# Patient Record
Sex: Female | Born: 1945
Health system: Southern US, Community
[De-identification: ages and names within clinical notes are randomized; demographics above are authoritative.]

## PROBLEM LIST (undated history)

## (undated) DIAGNOSIS — M1990 Unspecified osteoarthritis, unspecified site: Secondary | ICD-10-CM

## (undated) DIAGNOSIS — I10 Essential (primary) hypertension: Secondary | ICD-10-CM

## (undated) DIAGNOSIS — R001 Bradycardia, unspecified: Secondary | ICD-10-CM

## (undated) DIAGNOSIS — E119 Type 2 diabetes mellitus without complications: Secondary | ICD-10-CM

## (undated) DIAGNOSIS — G4730 Sleep apnea, unspecified: Secondary | ICD-10-CM

## (undated) HISTORY — PX: TOTAL ABDOMINAL HYSTERECTOMY: SHX209

## (undated) HISTORY — DX: Bradycardia, unspecified: R00.1

## (undated) HISTORY — DX: Sleep apnea, unspecified: G47.30

## (undated) HISTORY — DX: Essential (primary) hypertension: I10

## (undated) HISTORY — PX: REPLACEMENT TOTAL KNEE BILATERAL: SUR1225

## (undated) HISTORY — DX: Type 2 diabetes mellitus without complications: E11.9

## (undated) HISTORY — DX: Unspecified osteoarthritis, unspecified site: M19.90

---

## 1973-06-27 HISTORY — PX: BACK SURGERY: SHX140

## 2015-02-24 DIAGNOSIS — K219 Gastro-esophageal reflux disease without esophagitis: Secondary | ICD-10-CM | POA: Insufficient documentation

## 2015-02-24 DIAGNOSIS — J309 Allergic rhinitis, unspecified: Secondary | ICD-10-CM | POA: Insufficient documentation

## 2015-02-24 DIAGNOSIS — E119 Type 2 diabetes mellitus without complications: Secondary | ICD-10-CM | POA: Insufficient documentation

## 2016-07-01 DIAGNOSIS — Z7984 Long term (current) use of oral hypoglycemic drugs: Secondary | ICD-10-CM | POA: Diagnosis not present

## 2016-07-01 DIAGNOSIS — I10 Essential (primary) hypertension: Secondary | ICD-10-CM | POA: Diagnosis not present

## 2016-07-01 DIAGNOSIS — I2510 Atherosclerotic heart disease of native coronary artery without angina pectoris: Secondary | ICD-10-CM | POA: Diagnosis not present

## 2016-07-01 DIAGNOSIS — R05 Cough: Secondary | ICD-10-CM | POA: Diagnosis not present

## 2016-07-01 DIAGNOSIS — K219 Gastro-esophageal reflux disease without esophagitis: Secondary | ICD-10-CM | POA: Diagnosis not present

## 2016-07-01 DIAGNOSIS — Z7901 Long term (current) use of anticoagulants: Secondary | ICD-10-CM | POA: Diagnosis not present

## 2016-07-01 DIAGNOSIS — E785 Hyperlipidemia, unspecified: Secondary | ICD-10-CM | POA: Diagnosis not present

## 2016-07-01 DIAGNOSIS — R509 Fever, unspecified: Secondary | ICD-10-CM | POA: Diagnosis not present

## 2016-07-01 DIAGNOSIS — Z79899 Other long term (current) drug therapy: Secondary | ICD-10-CM | POA: Diagnosis not present

## 2016-07-01 DIAGNOSIS — J069 Acute upper respiratory infection, unspecified: Secondary | ICD-10-CM | POA: Diagnosis not present

## 2016-07-01 DIAGNOSIS — J09X2 Influenza due to identified novel influenza A virus with other respiratory manifestations: Secondary | ICD-10-CM | POA: Diagnosis not present

## 2016-07-01 DIAGNOSIS — J209 Acute bronchitis, unspecified: Secondary | ICD-10-CM | POA: Diagnosis not present

## 2016-07-21 DIAGNOSIS — E559 Vitamin D deficiency, unspecified: Secondary | ICD-10-CM | POA: Diagnosis not present

## 2016-07-21 DIAGNOSIS — I119 Hypertensive heart disease without heart failure: Secondary | ICD-10-CM | POA: Diagnosis not present

## 2016-07-21 DIAGNOSIS — G4733 Obstructive sleep apnea (adult) (pediatric): Secondary | ICD-10-CM | POA: Diagnosis not present

## 2016-07-21 DIAGNOSIS — E119 Type 2 diabetes mellitus without complications: Secondary | ICD-10-CM | POA: Diagnosis not present

## 2016-07-21 DIAGNOSIS — M170 Bilateral primary osteoarthritis of knee: Secondary | ICD-10-CM | POA: Diagnosis not present

## 2016-07-22 DIAGNOSIS — Z23 Encounter for immunization: Secondary | ICD-10-CM | POA: Diagnosis not present

## 2016-08-23 DIAGNOSIS — E119 Type 2 diabetes mellitus without complications: Secondary | ICD-10-CM | POA: Diagnosis not present

## 2016-08-23 DIAGNOSIS — H35371 Puckering of macula, right eye: Secondary | ICD-10-CM | POA: Diagnosis not present

## 2016-08-23 DIAGNOSIS — H43812 Vitreous degeneration, left eye: Secondary | ICD-10-CM | POA: Diagnosis not present

## 2016-09-19 DIAGNOSIS — I10 Essential (primary) hypertension: Secondary | ICD-10-CM | POA: Diagnosis not present

## 2016-09-19 DIAGNOSIS — E7800 Pure hypercholesterolemia, unspecified: Secondary | ICD-10-CM | POA: Diagnosis not present

## 2016-09-19 DIAGNOSIS — E119 Type 2 diabetes mellitus without complications: Secondary | ICD-10-CM | POA: Diagnosis not present

## 2016-10-20 DIAGNOSIS — M170 Bilateral primary osteoarthritis of knee: Secondary | ICD-10-CM | POA: Diagnosis not present

## 2016-10-20 DIAGNOSIS — E559 Vitamin D deficiency, unspecified: Secondary | ICD-10-CM | POA: Diagnosis not present

## 2016-10-20 DIAGNOSIS — R42 Dizziness and giddiness: Secondary | ICD-10-CM | POA: Diagnosis not present

## 2016-10-20 DIAGNOSIS — I119 Hypertensive heart disease without heart failure: Secondary | ICD-10-CM | POA: Diagnosis not present

## 2016-10-20 DIAGNOSIS — E119 Type 2 diabetes mellitus without complications: Secondary | ICD-10-CM | POA: Diagnosis not present

## 2016-10-31 DIAGNOSIS — I119 Hypertensive heart disease without heart failure: Secondary | ICD-10-CM | POA: Diagnosis not present

## 2016-10-31 DIAGNOSIS — E2839 Other primary ovarian failure: Secondary | ICD-10-CM | POA: Diagnosis not present

## 2016-10-31 DIAGNOSIS — J301 Allergic rhinitis due to pollen: Secondary | ICD-10-CM | POA: Diagnosis not present

## 2016-10-31 DIAGNOSIS — E559 Vitamin D deficiency, unspecified: Secondary | ICD-10-CM | POA: Diagnosis not present

## 2016-10-31 DIAGNOSIS — E119 Type 2 diabetes mellitus without complications: Secondary | ICD-10-CM | POA: Diagnosis not present

## 2016-11-10 DIAGNOSIS — E2839 Other primary ovarian failure: Secondary | ICD-10-CM | POA: Diagnosis not present

## 2017-01-30 DIAGNOSIS — G4733 Obstructive sleep apnea (adult) (pediatric): Secondary | ICD-10-CM | POA: Diagnosis not present

## 2017-01-30 DIAGNOSIS — E7800 Pure hypercholesterolemia, unspecified: Secondary | ICD-10-CM | POA: Diagnosis not present

## 2017-01-30 DIAGNOSIS — E119 Type 2 diabetes mellitus without complications: Secondary | ICD-10-CM | POA: Diagnosis not present

## 2017-01-30 DIAGNOSIS — J301 Allergic rhinitis due to pollen: Secondary | ICD-10-CM | POA: Diagnosis not present

## 2017-01-30 DIAGNOSIS — I10 Essential (primary) hypertension: Secondary | ICD-10-CM | POA: Diagnosis not present

## 2017-02-18 DIAGNOSIS — H9202 Otalgia, left ear: Secondary | ICD-10-CM | POA: Diagnosis not present

## 2017-02-18 DIAGNOSIS — H6502 Acute serous otitis media, left ear: Secondary | ICD-10-CM | POA: Diagnosis not present

## 2017-02-18 DIAGNOSIS — J019 Acute sinusitis, unspecified: Secondary | ICD-10-CM | POA: Diagnosis not present

## 2017-02-18 DIAGNOSIS — Z7984 Long term (current) use of oral hypoglycemic drugs: Secondary | ICD-10-CM | POA: Diagnosis not present

## 2017-02-18 DIAGNOSIS — H6592 Unspecified nonsuppurative otitis media, left ear: Secondary | ICD-10-CM | POA: Diagnosis not present

## 2017-02-18 DIAGNOSIS — Z79899 Other long term (current) drug therapy: Secondary | ICD-10-CM | POA: Diagnosis not present

## 2017-02-21 DIAGNOSIS — H43812 Vitreous degeneration, left eye: Secondary | ICD-10-CM | POA: Diagnosis not present

## 2017-02-21 DIAGNOSIS — H35371 Puckering of macula, right eye: Secondary | ICD-10-CM | POA: Diagnosis not present

## 2017-02-28 DIAGNOSIS — H9202 Otalgia, left ear: Secondary | ICD-10-CM | POA: Diagnosis not present

## 2017-02-28 DIAGNOSIS — M26612 Adhesions and ankylosis of left temporomandibular joint: Secondary | ICD-10-CM | POA: Diagnosis not present

## 2017-02-28 DIAGNOSIS — H68023 Chronic Eustachian salpingitis, bilateral: Secondary | ICD-10-CM | POA: Diagnosis not present

## 2017-02-28 DIAGNOSIS — M26622 Arthralgia of left temporomandibular joint: Secondary | ICD-10-CM | POA: Diagnosis not present

## 2017-03-16 DIAGNOSIS — E119 Type 2 diabetes mellitus without complications: Secondary | ICD-10-CM | POA: Diagnosis not present

## 2017-03-16 DIAGNOSIS — I10 Essential (primary) hypertension: Secondary | ICD-10-CM | POA: Diagnosis not present

## 2017-03-16 DIAGNOSIS — E7800 Pure hypercholesterolemia, unspecified: Secondary | ICD-10-CM | POA: Diagnosis not present

## 2017-03-22 DIAGNOSIS — E119 Type 2 diabetes mellitus without complications: Secondary | ICD-10-CM | POA: Diagnosis not present

## 2017-03-22 DIAGNOSIS — I10 Essential (primary) hypertension: Secondary | ICD-10-CM | POA: Diagnosis not present

## 2017-03-22 DIAGNOSIS — E782 Mixed hyperlipidemia: Secondary | ICD-10-CM | POA: Diagnosis not present

## 2017-03-26 DIAGNOSIS — Z23 Encounter for immunization: Secondary | ICD-10-CM | POA: Diagnosis not present

## 2017-04-25 DIAGNOSIS — G4733 Obstructive sleep apnea (adult) (pediatric): Secondary | ICD-10-CM | POA: Diagnosis not present

## 2017-05-29 DIAGNOSIS — G4733 Obstructive sleep apnea (adult) (pediatric): Secondary | ICD-10-CM | POA: Diagnosis not present

## 2017-05-29 DIAGNOSIS — E119 Type 2 diabetes mellitus without complications: Secondary | ICD-10-CM | POA: Diagnosis not present

## 2017-05-29 DIAGNOSIS — M5137 Other intervertebral disc degeneration, lumbosacral region: Secondary | ICD-10-CM | POA: Diagnosis not present

## 2017-05-29 DIAGNOSIS — I10 Essential (primary) hypertension: Secondary | ICD-10-CM | POA: Diagnosis not present

## 2017-05-29 DIAGNOSIS — D508 Other iron deficiency anemias: Secondary | ICD-10-CM | POA: Diagnosis not present

## 2017-05-29 DIAGNOSIS — E7889 Other lipoprotein metabolism disorders: Secondary | ICD-10-CM | POA: Diagnosis not present

## 2017-06-12 DIAGNOSIS — Z1231 Encounter for screening mammogram for malignant neoplasm of breast: Secondary | ICD-10-CM | POA: Diagnosis not present

## 2017-06-12 DIAGNOSIS — Z803 Family history of malignant neoplasm of breast: Secondary | ICD-10-CM | POA: Diagnosis not present

## 2017-06-12 DIAGNOSIS — N951 Menopausal and female climacteric states: Secondary | ICD-10-CM | POA: Diagnosis not present

## 2017-06-12 DIAGNOSIS — Z1389 Encounter for screening for other disorder: Secondary | ICD-10-CM | POA: Diagnosis not present

## 2017-07-25 DIAGNOSIS — G4733 Obstructive sleep apnea (adult) (pediatric): Secondary | ICD-10-CM | POA: Diagnosis not present

## 2017-10-06 DIAGNOSIS — H26492 Other secondary cataract, left eye: Secondary | ICD-10-CM | POA: Diagnosis not present

## 2017-10-06 DIAGNOSIS — H35371 Puckering of macula, right eye: Secondary | ICD-10-CM | POA: Diagnosis not present

## 2017-10-06 DIAGNOSIS — H02831 Dermatochalasis of right upper eyelid: Secondary | ICD-10-CM | POA: Diagnosis not present

## 2017-10-09 ENCOUNTER — Other Ambulatory Visit: Payer: Self-pay | Admitting: Family Medicine

## 2017-10-09 DIAGNOSIS — J301 Allergic rhinitis due to pollen: Secondary | ICD-10-CM | POA: Diagnosis not present

## 2017-10-09 DIAGNOSIS — G4733 Obstructive sleep apnea (adult) (pediatric): Secondary | ICD-10-CM | POA: Diagnosis not present

## 2017-10-09 DIAGNOSIS — Z6841 Body Mass Index (BMI) 40.0 and over, adult: Secondary | ICD-10-CM | POA: Diagnosis not present

## 2017-10-09 DIAGNOSIS — K219 Gastro-esophageal reflux disease without esophagitis: Secondary | ICD-10-CM | POA: Diagnosis not present

## 2017-10-09 DIAGNOSIS — S060X0A Concussion without loss of consciousness, initial encounter: Secondary | ICD-10-CM

## 2017-10-09 DIAGNOSIS — W19XXXA Unspecified fall, initial encounter: Secondary | ICD-10-CM | POA: Diagnosis not present

## 2017-10-09 DIAGNOSIS — E7800 Pure hypercholesterolemia, unspecified: Secondary | ICD-10-CM | POA: Diagnosis not present

## 2017-10-09 DIAGNOSIS — E1169 Type 2 diabetes mellitus with other specified complication: Secondary | ICD-10-CM | POA: Diagnosis not present

## 2017-10-09 DIAGNOSIS — I10 Essential (primary) hypertension: Secondary | ICD-10-CM | POA: Diagnosis not present

## 2017-10-11 ENCOUNTER — Other Ambulatory Visit: Payer: Self-pay

## 2017-10-11 ENCOUNTER — Ambulatory Visit
Admission: RE | Admit: 2017-10-11 | Discharge: 2017-10-11 | Disposition: A | Discharge status: Home / Self Care | Payer: Medicare Other | Source: Ambulatory Visit | Attending: Family Medicine | Admitting: Family Medicine

## 2017-10-11 DIAGNOSIS — R51 Headache: Secondary | ICD-10-CM | POA: Diagnosis not present

## 2017-10-11 DIAGNOSIS — S060X0A Concussion without loss of consciousness, initial encounter: Secondary | ICD-10-CM

## 2017-10-11 DIAGNOSIS — S0990XA Unspecified injury of head, initial encounter: Secondary | ICD-10-CM | POA: Diagnosis not present

## 2017-10-17 DIAGNOSIS — M545 Low back pain: Secondary | ICD-10-CM | POA: Diagnosis not present

## 2017-10-17 DIAGNOSIS — M2560 Stiffness of unspecified joint, not elsewhere classified: Secondary | ICD-10-CM | POA: Diagnosis not present

## 2017-10-17 DIAGNOSIS — M6281 Muscle weakness (generalized): Secondary | ICD-10-CM | POA: Diagnosis not present

## 2017-10-17 DIAGNOSIS — R262 Difficulty in walking, not elsewhere classified: Secondary | ICD-10-CM | POA: Diagnosis not present

## 2017-10-20 DIAGNOSIS — M545 Low back pain: Secondary | ICD-10-CM | POA: Diagnosis not present

## 2017-10-20 DIAGNOSIS — M2560 Stiffness of unspecified joint, not elsewhere classified: Secondary | ICD-10-CM | POA: Diagnosis not present

## 2017-10-20 DIAGNOSIS — M6281 Muscle weakness (generalized): Secondary | ICD-10-CM | POA: Diagnosis not present

## 2017-10-20 DIAGNOSIS — R262 Difficulty in walking, not elsewhere classified: Secondary | ICD-10-CM | POA: Diagnosis not present

## 2017-10-23 DIAGNOSIS — R262 Difficulty in walking, not elsewhere classified: Secondary | ICD-10-CM | POA: Diagnosis not present

## 2017-10-23 DIAGNOSIS — M6281 Muscle weakness (generalized): Secondary | ICD-10-CM | POA: Diagnosis not present

## 2017-10-23 DIAGNOSIS — M545 Low back pain: Secondary | ICD-10-CM | POA: Diagnosis not present

## 2017-10-23 DIAGNOSIS — M2560 Stiffness of unspecified joint, not elsewhere classified: Secondary | ICD-10-CM | POA: Diagnosis not present

## 2017-10-27 DIAGNOSIS — R262 Difficulty in walking, not elsewhere classified: Secondary | ICD-10-CM | POA: Diagnosis not present

## 2017-10-27 DIAGNOSIS — M545 Low back pain: Secondary | ICD-10-CM | POA: Diagnosis not present

## 2017-10-27 DIAGNOSIS — M6281 Muscle weakness (generalized): Secondary | ICD-10-CM | POA: Diagnosis not present

## 2017-10-27 DIAGNOSIS — M2560 Stiffness of unspecified joint, not elsewhere classified: Secondary | ICD-10-CM | POA: Diagnosis not present

## 2017-10-30 DIAGNOSIS — M2560 Stiffness of unspecified joint, not elsewhere classified: Secondary | ICD-10-CM | POA: Diagnosis not present

## 2017-10-30 DIAGNOSIS — M545 Low back pain: Secondary | ICD-10-CM | POA: Diagnosis not present

## 2017-10-30 DIAGNOSIS — M6281 Muscle weakness (generalized): Secondary | ICD-10-CM | POA: Diagnosis not present

## 2017-10-30 DIAGNOSIS — R262 Difficulty in walking, not elsewhere classified: Secondary | ICD-10-CM | POA: Diagnosis not present

## 2017-11-03 DIAGNOSIS — M2560 Stiffness of unspecified joint, not elsewhere classified: Secondary | ICD-10-CM | POA: Diagnosis not present

## 2017-11-03 DIAGNOSIS — M6281 Muscle weakness (generalized): Secondary | ICD-10-CM | POA: Diagnosis not present

## 2017-11-03 DIAGNOSIS — M545 Low back pain: Secondary | ICD-10-CM | POA: Diagnosis not present

## 2017-11-03 DIAGNOSIS — R262 Difficulty in walking, not elsewhere classified: Secondary | ICD-10-CM | POA: Diagnosis not present

## 2017-11-06 DIAGNOSIS — M2560 Stiffness of unspecified joint, not elsewhere classified: Secondary | ICD-10-CM | POA: Diagnosis not present

## 2017-11-06 DIAGNOSIS — M545 Low back pain: Secondary | ICD-10-CM | POA: Diagnosis not present

## 2017-11-06 DIAGNOSIS — R262 Difficulty in walking, not elsewhere classified: Secondary | ICD-10-CM | POA: Diagnosis not present

## 2017-11-06 DIAGNOSIS — M6281 Muscle weakness (generalized): Secondary | ICD-10-CM | POA: Diagnosis not present

## 2017-11-10 DIAGNOSIS — M545 Low back pain: Secondary | ICD-10-CM | POA: Diagnosis not present

## 2017-11-10 DIAGNOSIS — M2560 Stiffness of unspecified joint, not elsewhere classified: Secondary | ICD-10-CM | POA: Diagnosis not present

## 2017-11-10 DIAGNOSIS — R262 Difficulty in walking, not elsewhere classified: Secondary | ICD-10-CM | POA: Diagnosis not present

## 2017-11-10 DIAGNOSIS — M6281 Muscle weakness (generalized): Secondary | ICD-10-CM | POA: Diagnosis not present

## 2017-11-13 DIAGNOSIS — M6281 Muscle weakness (generalized): Secondary | ICD-10-CM | POA: Diagnosis not present

## 2017-11-13 DIAGNOSIS — M545 Low back pain: Secondary | ICD-10-CM | POA: Diagnosis not present

## 2017-11-13 DIAGNOSIS — R262 Difficulty in walking, not elsewhere classified: Secondary | ICD-10-CM | POA: Diagnosis not present

## 2017-11-13 DIAGNOSIS — M2560 Stiffness of unspecified joint, not elsewhere classified: Secondary | ICD-10-CM | POA: Diagnosis not present

## 2017-11-17 DIAGNOSIS — M2560 Stiffness of unspecified joint, not elsewhere classified: Secondary | ICD-10-CM | POA: Diagnosis not present

## 2017-11-17 DIAGNOSIS — R262 Difficulty in walking, not elsewhere classified: Secondary | ICD-10-CM | POA: Diagnosis not present

## 2017-11-17 DIAGNOSIS — M545 Low back pain: Secondary | ICD-10-CM | POA: Diagnosis not present

## 2017-11-17 DIAGNOSIS — M6281 Muscle weakness (generalized): Secondary | ICD-10-CM | POA: Diagnosis not present

## 2017-11-21 DIAGNOSIS — M2560 Stiffness of unspecified joint, not elsewhere classified: Secondary | ICD-10-CM | POA: Diagnosis not present

## 2017-11-21 DIAGNOSIS — M545 Low back pain: Secondary | ICD-10-CM | POA: Diagnosis not present

## 2017-11-21 DIAGNOSIS — R262 Difficulty in walking, not elsewhere classified: Secondary | ICD-10-CM | POA: Diagnosis not present

## 2017-11-21 DIAGNOSIS — M6281 Muscle weakness (generalized): Secondary | ICD-10-CM | POA: Diagnosis not present

## 2017-11-22 DIAGNOSIS — G4733 Obstructive sleep apnea (adult) (pediatric): Secondary | ICD-10-CM | POA: Diagnosis not present

## 2017-11-24 DIAGNOSIS — M2560 Stiffness of unspecified joint, not elsewhere classified: Secondary | ICD-10-CM | POA: Diagnosis not present

## 2017-11-24 DIAGNOSIS — R262 Difficulty in walking, not elsewhere classified: Secondary | ICD-10-CM | POA: Diagnosis not present

## 2017-11-24 DIAGNOSIS — M6281 Muscle weakness (generalized): Secondary | ICD-10-CM | POA: Diagnosis not present

## 2017-11-24 DIAGNOSIS — M545 Low back pain: Secondary | ICD-10-CM | POA: Diagnosis not present

## 2017-11-27 DIAGNOSIS — R262 Difficulty in walking, not elsewhere classified: Secondary | ICD-10-CM | POA: Diagnosis not present

## 2017-11-27 DIAGNOSIS — M545 Low back pain: Secondary | ICD-10-CM | POA: Diagnosis not present

## 2017-11-27 DIAGNOSIS — M6281 Muscle weakness (generalized): Secondary | ICD-10-CM | POA: Diagnosis not present

## 2017-11-27 DIAGNOSIS — M2560 Stiffness of unspecified joint, not elsewhere classified: Secondary | ICD-10-CM | POA: Diagnosis not present

## 2017-12-01 DIAGNOSIS — M6281 Muscle weakness (generalized): Secondary | ICD-10-CM | POA: Diagnosis not present

## 2017-12-01 DIAGNOSIS — M2560 Stiffness of unspecified joint, not elsewhere classified: Secondary | ICD-10-CM | POA: Diagnosis not present

## 2017-12-01 DIAGNOSIS — M545 Low back pain: Secondary | ICD-10-CM | POA: Diagnosis not present

## 2017-12-01 DIAGNOSIS — R262 Difficulty in walking, not elsewhere classified: Secondary | ICD-10-CM | POA: Diagnosis not present

## 2017-12-04 DIAGNOSIS — R262 Difficulty in walking, not elsewhere classified: Secondary | ICD-10-CM | POA: Diagnosis not present

## 2017-12-04 DIAGNOSIS — M6281 Muscle weakness (generalized): Secondary | ICD-10-CM | POA: Diagnosis not present

## 2017-12-04 DIAGNOSIS — M2560 Stiffness of unspecified joint, not elsewhere classified: Secondary | ICD-10-CM | POA: Diagnosis not present

## 2017-12-04 DIAGNOSIS — M545 Low back pain: Secondary | ICD-10-CM | POA: Diagnosis not present

## 2018-03-29 DIAGNOSIS — K219 Gastro-esophageal reflux disease without esophagitis: Secondary | ICD-10-CM | POA: Diagnosis not present

## 2018-03-29 DIAGNOSIS — Z86010 Personal history of colonic polyps: Secondary | ICD-10-CM | POA: Diagnosis not present

## 2018-03-29 DIAGNOSIS — R1310 Dysphagia, unspecified: Secondary | ICD-10-CM | POA: Diagnosis not present

## 2018-04-16 DIAGNOSIS — Z1389 Encounter for screening for other disorder: Secondary | ICD-10-CM | POA: Diagnosis not present

## 2018-04-16 DIAGNOSIS — G4733 Obstructive sleep apnea (adult) (pediatric): Secondary | ICD-10-CM | POA: Diagnosis not present

## 2018-04-16 DIAGNOSIS — Z23 Encounter for immunization: Secondary | ICD-10-CM | POA: Diagnosis not present

## 2018-04-16 DIAGNOSIS — K219 Gastro-esophageal reflux disease without esophagitis: Secondary | ICD-10-CM | POA: Diagnosis not present

## 2018-04-16 DIAGNOSIS — I10 Essential (primary) hypertension: Secondary | ICD-10-CM | POA: Diagnosis not present

## 2018-04-16 DIAGNOSIS — M545 Low back pain: Secondary | ICD-10-CM | POA: Diagnosis not present

## 2018-04-16 DIAGNOSIS — Z0000 Encounter for general adult medical examination without abnormal findings: Secondary | ICD-10-CM | POA: Diagnosis not present

## 2018-04-16 DIAGNOSIS — E7800 Pure hypercholesterolemia, unspecified: Secondary | ICD-10-CM | POA: Diagnosis not present

## 2018-04-16 DIAGNOSIS — E1169 Type 2 diabetes mellitus with other specified complication: Secondary | ICD-10-CM | POA: Diagnosis not present

## 2018-04-16 DIAGNOSIS — Z1159 Encounter for screening for other viral diseases: Secondary | ICD-10-CM | POA: Diagnosis not present

## 2018-04-19 ENCOUNTER — Other Ambulatory Visit: Payer: Self-pay | Admitting: Family Medicine

## 2018-04-19 DIAGNOSIS — Z1231 Encounter for screening mammogram for malignant neoplasm of breast: Secondary | ICD-10-CM

## 2018-04-20 DIAGNOSIS — M5136 Other intervertebral disc degeneration, lumbar region: Secondary | ICD-10-CM | POA: Diagnosis not present

## 2018-04-20 DIAGNOSIS — E7800 Pure hypercholesterolemia, unspecified: Secondary | ICD-10-CM | POA: Insufficient documentation

## 2018-04-20 DIAGNOSIS — M5416 Radiculopathy, lumbar region: Secondary | ICD-10-CM | POA: Diagnosis not present

## 2018-04-20 DIAGNOSIS — M545 Low back pain: Secondary | ICD-10-CM | POA: Diagnosis not present

## 2018-04-20 DIAGNOSIS — I82409 Acute embolism and thrombosis of unspecified deep veins of unspecified lower extremity: Secondary | ICD-10-CM | POA: Insufficient documentation

## 2018-05-01 DIAGNOSIS — M545 Low back pain: Secondary | ICD-10-CM | POA: Diagnosis not present

## 2018-05-07 DIAGNOSIS — M48062 Spinal stenosis, lumbar region with neurogenic claudication: Secondary | ICD-10-CM | POA: Diagnosis not present

## 2018-05-07 DIAGNOSIS — M48061 Spinal stenosis, lumbar region without neurogenic claudication: Secondary | ICD-10-CM | POA: Insufficient documentation

## 2018-05-08 DIAGNOSIS — Z86010 Personal history of colonic polyps: Secondary | ICD-10-CM | POA: Diagnosis not present

## 2018-05-08 DIAGNOSIS — R1310 Dysphagia, unspecified: Secondary | ICD-10-CM | POA: Diagnosis not present

## 2018-05-08 DIAGNOSIS — K219 Gastro-esophageal reflux disease without esophagitis: Secondary | ICD-10-CM | POA: Diagnosis not present

## 2018-05-08 DIAGNOSIS — K635 Polyp of colon: Secondary | ICD-10-CM | POA: Diagnosis not present

## 2018-05-08 DIAGNOSIS — K5730 Diverticulosis of large intestine without perforation or abscess without bleeding: Secondary | ICD-10-CM | POA: Diagnosis not present

## 2018-05-08 DIAGNOSIS — D126 Benign neoplasm of colon, unspecified: Secondary | ICD-10-CM | POA: Diagnosis not present

## 2018-05-08 DIAGNOSIS — K640 First degree hemorrhoids: Secondary | ICD-10-CM | POA: Diagnosis not present

## 2018-05-11 DIAGNOSIS — D126 Benign neoplasm of colon, unspecified: Secondary | ICD-10-CM | POA: Diagnosis not present

## 2018-05-11 DIAGNOSIS — K635 Polyp of colon: Secondary | ICD-10-CM | POA: Diagnosis not present

## 2018-05-29 DIAGNOSIS — N390 Urinary tract infection, site not specified: Secondary | ICD-10-CM | POA: Diagnosis not present

## 2018-05-29 DIAGNOSIS — R300 Dysuria: Secondary | ICD-10-CM | POA: Diagnosis not present

## 2018-06-28 ENCOUNTER — Encounter: Payer: Self-pay | Admitting: Radiology

## 2018-06-28 ENCOUNTER — Ambulatory Visit
Admission: RE | Admit: 2018-06-28 | Discharge: 2018-06-28 | Disposition: A | Discharge status: Home / Self Care | Payer: Medicare Other | Source: Ambulatory Visit | Attending: Family Medicine | Admitting: Family Medicine

## 2018-06-28 DIAGNOSIS — Z1231 Encounter for screening mammogram for malignant neoplasm of breast: Secondary | ICD-10-CM

## 2018-10-17 DIAGNOSIS — Z7984 Long term (current) use of oral hypoglycemic drugs: Secondary | ICD-10-CM | POA: Diagnosis not present

## 2018-10-17 DIAGNOSIS — E1169 Type 2 diabetes mellitus with other specified complication: Secondary | ICD-10-CM | POA: Diagnosis not present

## 2018-10-17 DIAGNOSIS — K219 Gastro-esophageal reflux disease without esophagitis: Secondary | ICD-10-CM | POA: Diagnosis not present

## 2018-10-17 DIAGNOSIS — E7800 Pure hypercholesterolemia, unspecified: Secondary | ICD-10-CM | POA: Diagnosis not present

## 2018-10-17 DIAGNOSIS — I10 Essential (primary) hypertension: Secondary | ICD-10-CM | POA: Diagnosis not present

## 2018-11-26 DIAGNOSIS — G4733 Obstructive sleep apnea (adult) (pediatric): Secondary | ICD-10-CM | POA: Diagnosis not present

## 2018-11-29 DIAGNOSIS — J0100 Acute maxillary sinusitis, unspecified: Secondary | ICD-10-CM | POA: Diagnosis not present

## 2018-12-03 ENCOUNTER — Emergency Department (HOSPITAL_COMMUNITY): Payer: Medicare Other

## 2018-12-03 ENCOUNTER — Other Ambulatory Visit: Payer: Self-pay

## 2018-12-03 ENCOUNTER — Encounter (HOSPITAL_COMMUNITY): Payer: Self-pay | Admitting: Emergency Medicine

## 2018-12-03 ENCOUNTER — Emergency Department (HOSPITAL_COMMUNITY)
Admission: EM | Admit: 2018-12-03 | Discharge: 2018-12-04 | Disposition: A | Discharge status: Home / Self Care | Payer: Medicare Other | Attending: Emergency Medicine | Admitting: Emergency Medicine

## 2018-12-03 DIAGNOSIS — R51 Headache: Secondary | ICD-10-CM | POA: Diagnosis not present

## 2018-12-03 DIAGNOSIS — R42 Dizziness and giddiness: Secondary | ICD-10-CM | POA: Diagnosis not present

## 2018-12-03 DIAGNOSIS — R0981 Nasal congestion: Secondary | ICD-10-CM | POA: Insufficient documentation

## 2018-12-03 DIAGNOSIS — I6523 Occlusion and stenosis of bilateral carotid arteries: Secondary | ICD-10-CM | POA: Diagnosis not present

## 2018-12-03 DIAGNOSIS — S0990XA Unspecified injury of head, initial encounter: Secondary | ICD-10-CM | POA: Diagnosis not present

## 2018-12-03 DIAGNOSIS — H9201 Otalgia, right ear: Secondary | ICD-10-CM | POA: Insufficient documentation

## 2018-12-03 DIAGNOSIS — R519 Headache, unspecified: Secondary | ICD-10-CM

## 2018-12-03 LAB — CBC
HCT: 40.1 % (ref 36.0–46.0)
Hemoglobin: 13 g/dL (ref 12.0–15.0)
MCH: 30.2 pg (ref 26.0–34.0)
MCHC: 32.4 g/dL (ref 30.0–36.0)
MCV: 93 fL (ref 80.0–100.0)
Platelets: 268 10*3/uL (ref 150–400)
RBC: 4.31 MIL/uL (ref 3.87–5.11)
RDW: 12.4 % (ref 11.5–15.5)
WBC: 6.1 10*3/uL (ref 4.0–10.5)
nRBC: 0 % (ref 0.0–0.2)

## 2018-12-03 LAB — DIFFERENTIAL
Abs Immature Granulocytes: 0.01 10*3/uL (ref 0.00–0.07)
Basophils Absolute: 0 10*3/uL (ref 0.0–0.1)
Basophils Relative: 0 %
Eosinophils Absolute: 0.1 10*3/uL (ref 0.0–0.5)
Eosinophils Relative: 1 %
Immature Granulocytes: 0 %
Lymphocytes Relative: 53 %
Lymphs Abs: 3.2 10*3/uL (ref 0.7–4.0)
Monocytes Absolute: 0.4 10*3/uL (ref 0.1–1.0)
Monocytes Relative: 7 %
Neutro Abs: 2.4 10*3/uL (ref 1.7–7.7)
Neutrophils Relative %: 39 %

## 2018-12-03 LAB — PROTIME-INR
INR: 1.1 (ref 0.8–1.2)
Prothrombin Time: 13.7 seconds (ref 11.4–15.2)

## 2018-12-03 LAB — COMPREHENSIVE METABOLIC PANEL
ALT: 24 U/L (ref 0–44)
AST: 32 U/L (ref 15–41)
Albumin: 4.1 g/dL (ref 3.5–5.0)
Alkaline Phosphatase: 68 U/L (ref 38–126)
Anion gap: 13 (ref 5–15)
BUN: 7 mg/dL — ABNORMAL LOW (ref 8–23)
CO2: 22 mmol/L (ref 22–32)
Calcium: 9.7 mg/dL (ref 8.9–10.3)
Chloride: 105 mmol/L (ref 98–111)
Creatinine, Ser: 0.72 mg/dL (ref 0.44–1.00)
GFR calc Af Amer: >60 mL/min (ref >60)
GFR calc non Af Amer: >60 mL/min (ref >60)
Glucose, Bld: 94 mg/dL (ref 70–99)
Potassium: 3.8 mmol/L (ref 3.5–5.1)
Sodium: 140 mmol/L (ref 135–145)
Total Bilirubin: 0.5 mg/dL (ref 0.3–1.2)
Total Protein: 8.2 g/dL — ABNORMAL HIGH (ref 6.5–8.1)

## 2018-12-03 LAB — I-STAT CHEM 8, ED
BUN: 8 mg/dL (ref 8–23)
Calcium, Ion: 0.97 mmol/L — ABNORMAL LOW (ref 1.15–1.40)
Chloride: 106 mmol/L (ref 98–111)
Creatinine, Ser: 0.6 mg/dL (ref 0.44–1.00)
Glucose, Bld: 87 mg/dL (ref 70–99)
HCT: 39 % (ref 36.0–46.0)
Hemoglobin: 13.3 g/dL (ref 12.0–15.0)
Potassium: 3.8 mmol/L (ref 3.5–5.1)
Sodium: 138 mmol/L (ref 135–145)
TCO2: 25 mmol/L (ref 22–32)

## 2018-12-03 LAB — APTT: aPTT: 34 seconds (ref 24–36)

## 2018-12-03 MED ORDER — MECLIZINE HCL 25 MG PO TABS
25.0000 mg | ORAL_TABLET | Freq: Once | ORAL | Status: AC
  Administered 2018-12-03: 23:00:00 25 mg via ORAL
  Filled 2018-12-03: qty 1

## 2018-12-03 MED ORDER — KETOROLAC TROMETHAMINE 15 MG/ML IJ SOLN
15.0000 mg | Freq: Once | INTRAMUSCULAR | Status: AC
  Administered 2018-12-04: 15 mg via INTRAVENOUS
  Filled 2018-12-03: qty 1

## 2018-12-03 MED ORDER — IOHEXOL 350 MG/ML SOLN
75.0000 mL | Freq: Once | INTRAVENOUS | Status: AC | PRN
  Administered 2018-12-03: 75 mL via INTRAVENOUS

## 2018-12-03 MED ORDER — SODIUM CHLORIDE 0.9% FLUSH
3.0000 mL | Freq: Once | INTRAVENOUS | Status: AC
  Administered 2018-12-03: 3 mL via INTRAVENOUS

## 2018-12-03 NOTE — ED Triage Notes (Addendum)
Pt reports that on Thursday she began to have a headache, dizziness, and right ear pain. Pt unable to cure same with home remedies.

## 2018-12-03 NOTE — ED Provider Notes (Signed)
[facility] EMER[facility]ENC[facility] DEPARTMENT Provider Note   CSN: [id number] Arrival date & time: 12/03/18  1415  History   Chief Complaint Chief Complaint  Patient presents with   Headache   Otalgia    Right   Dizziness   HPI Kylie Brown is a 73 y.o. female with past medical history significant for bradycardia who presents for evaluation of HA, dizziness and ear pain. Symptoms began on Thursday of last week. HA and ear pain located to the right side of her head. Saw seen by PCP telemedicine visit and given amoxicillin.  States PCP thought that this was related to a possible sinus infection and prescribed amoxicillin.  Patient states she has had some mild congestion and rhinorrhea however she thought this was related to her seasonal allergies.  She denies sudden onset thunderclap headache.  Headache began gradually.  She has been taking Tylenol at home without relief of her symptoms.  Current head pain is rated a 7/10.  States she also has a pressure to her right ear and pain behind her right ear.  She denies radiation of pain into her neck, neck stiffness or neck rigidity.  States she also has associated dizziness, worse with movement.  Denies prior history of vertigo.  Denies fever, chills, nausea, vomiting, slurred speech, facial asymmetry, unilateral weakness, chest pain, shortness of breath, abdominal pain, diarrhea, dysuria, faulty with word finding, difficulty with ambulation.  Symptoms constant since onset.  History obtained from patient.  No interpreter was used.   HPI  History reviewed. No pertinent past medical history.  There are no active problems to display for this patient.   History reviewed. No pertinent surgical history.   OB History   No obstetric history on file.    Home Medications    Prior to Admission medications   Medication Sig Start Date End Date Taking? Authorizing Provider  meclizine (ANTIVERT) 25 MG tablet Take 1 tablet (25 mg total) by  mouth 3 (three) times daily as needed for dizziness. 12/04/18   Chaun Uemura A, PA-C   Family History Family History  Problem Relation Age of Onset   Breast cancer Neg Hx     Social History Social History   Tobacco Use   Smoking status: Never Smoker   Smokeless tobacco: Never Used  Substance Use Topics   Alcohol use: Not Currently   Drug use: Never     Allergies   Patient has no known allergies.   Review of Systems Review of Systems  Constitutional: Negative.   HENT: Positive for congestion, ear pain and rhinorrhea. Negative for dental problem, drooling, ear discharge, facial swelling, hearing loss, mouth sores, nosebleeds, postnasal drip, sinus pressure, sinus pain, sneezing, sore throat and trouble swallowing.   Eyes: Negative.   Respiratory: Negative.   Cardiovascular: Negative.   Gastrointestinal: Negative.   Genitourinary: Negative.   Musculoskeletal: Negative.   Skin: Negative.   Neurological: Positive for dizziness and headaches. Negative for tremors, seizures, syncope, facial asymmetry, speech difficulty, weakness, light-headedness and numbness.  All other systems reviewed and are negative.  Physical Exam Updated Vital Signs BP (!) 159/72    Pulse (!) 41    Temp 99 F (37.2 C) (Oral)    Resp (!) 23    Ht 5\' 6"  (1.676 m)    Wt 111.6 kg    LMP  (Exact Date)    SpO2 97%    BMI 39.71 kg/m   Physical Exam  Physical Exam  Constitutional: Pt is oriented to  person, place, and time. Pt appears well-developed and well-nourished. No distress.  HENT:  Head: Normocephalic and atraumatic.  Mouth/Throat: Oropharynx is clear and moist.  Eyes: Conjunctivae and EOM are normal. Pupils are equal, round, and reactive to light. No scleral icterus.  No horizontal, vertical or rotational nystagmus  Ears: Mild cerumen in bilateral ears. No mastoid tenderness. No bulging, discharge. Neck: Normal range of motion. Neck supple.  Full active and passive ROM without pain No  midline or paraspinal tenderness No nuchal rigidity or meningeal signs  Cardiovascular: Normal rate, regular rhythm and intact distal pulses.   Pulmonary/Chest: Effort normal and breath sounds normal. No respiratory distress. Pt has no wheezes. No rales.  Abdominal: Soft. Bowel sounds are normal. There is no tenderness. There is no rebound and no guarding.  Musculoskeletal: Normal range of motion.  Lymphadenopathy:    No cervical adenopathy.  Neurological: Pt. is alert and oriented to person, place, and time. He has normal reflexes. No cranial nerve deficit.  Exhibits normal muscle tone. Coordination normal.  Mental Status:  Alert, oriented, thought content appropriate. Speech fluent without evidence of aphasia. Able to follow 2 step commands without difficulty.  Cranial Nerves:  II:  Peripheral visual fields grossly normal, pupils equal, round, reactive to light III,IV, VI: ptosis not present, extra-ocular motions intact bilaterally  V,VII: smile symmetric, facial light touch sensation equal VIII: hearing grossly normal bilaterally  IX,X: midline uvula rise  XI: bilateral shoulder shrug equal and strong XII: midline tongue extension  Motor:  5/5 in upper and lower extremities bilaterally including strong and equal grip strength and dorsiflexion/plantar flexion Sensory: Pinprick and light touch normal in all extremities.  Deep Tendon Reflexes: 2+ and symmetric  Cerebellar: normal finger-to-nose with bilateral upper extremities Gait: normal gait and balance CV: distal pulses palpable throughout   Skin: Skin is warm and dry. No rash noted. Pt is not diaphoretic.  Psychiatric: Pt has a normal mood and affect. Behavior is normal. Judgment and thought content normal.  Nursing note and vitals reviewed. ED Treatments / Results  Labs (all labs ordered are listed, but only abnormal results are displayed) Labs Reviewed  COMPREHENSIVE METABOLIC PANEL - Abnormal; Notable for the following  components:      Result Value   BUN 7 (*)    Total Protein 8.2 (*)    All other components within normal limits  I-STAT CHEM 8, ED - Abnormal; Notable for the following components:   Calcium, Ion 0.97 (*)    All other components within normal limits  PROTIME-INR  APTT  CBC  DIFFERENTIAL  CBG MONITORING, ED    EKG None  Radiology Ct Angio Head W/cm &/or Wo Cm  Result Date: 12/03/2018 CLINICAL DATA:  Initial evaluation for acute headache, dizziness. EXAM: CT ANGIOGRAPHY HEAD AND NECK TECHNIQUE: Multidetector CT imaging of the head and neck was performed using the standard protocol during bolus administration of intravenous contrast. Multiplanar CT image reconstructions and MIPs were obtained to evaluate the vascular anatomy. Carotid stenosis measurements (when applicable) are obtained utilizing NASCET criteria, using the distal internal carotid diameter as the denominator. CONTRAST:  110mL OMNIPAQUE IOHEXOL 350 MG/ML SOLN COMPARISON:  Prior head CT from earlier the same day. FINDINGS: CTA NECK FINDINGS Aortic arch: Visualized aortic arch of normal caliber with normal 3 vessel morphology. Mild atheromatous plaque within the lateral aspect of the arch noted. No hemodynamically significant stenosis seen about the origin of the great vessels. Visualized subclavian arteries widely patent. Right carotid system: Right common  and internal carotid arteries widely patent without stenosis, dissection, or occlusion. No significant atheromatous narrowing about the right carotid bifurcation. Left carotid system: Left common and internal carotid arteries widely patent without stenosis, dissection or occlusion. No significant atheromatous narrowing about the left carotid bifurcation. Vertebral arteries: Both vertebral arteries arise from the subclavian arteries. Left vertebral slightly dominant. Vertebral arteries widely patent within the neck without stenosis or other acute vascular abnormality. Skeleton: No acute  osseous abnormality. No discrete lytic or blastic osseous lesions. Prominent degenerative spondylolysis at C3-4 through C6-7 with partial ankylosis at C4 through C6. Associated prominent posterior disc osteophytes and/or O PLL at these levels with moderate to severe spinal stenosis, most pronounced at C4-5. Other neck: No other acute soft tissue abnormality within the neck. Right parotid gland markedly atrophic and/or absent. Upper chest: Visualized upper chest demonstrates no acute finding. Review of the MIP images confirms the above findings CTA HEAD FINDINGS Anterior circulation: Petrous segments widely patent bilaterally. Mild scattered atheromatous plaque within the carotid siphons without hemodynamically significant stenosis. ICA termini well perfused. A1 segments patent bilaterally. Normal anterior communicating artery. Anterior cerebral arteries widely patent to their distal aspects. No M1 stenosis or occlusion. Distal MCA branches well perfused and symmetric. Posterior circulation: Vertebral arteries patent to the vertebrobasilar junction without stenosis. Gross patency of the left PICA proximally. Right PICA not seen. Basilar patent to its distal aspect without stenosis. Superior cerebral arteries patent bilaterally. Both of the posterior cerebral arteries primarily supplied via the basilar and are well perfused to their distal aspects. Venous sinuses: Grossly patent allowing for timing of the contrast bolus. Anatomic variants: None significant. No intracranial aneurysm or other vascular abnormality. Delayed phase: Not for para Review of the MIP images confirms the above findings IMPRESSION: 1. Negative CTA of the head and neck. No emergent large vessel occlusion. No dissection, hemodynamically significant stenosis, or other acute vascular abnormality. 2. Mild atheromatous plaque within the carotid siphons without significant stenosis. 3. Moderate to advanced degenerative spondylolysis at C3-4 through C6-7  with resultant moderate to severe spinal stenosis, most pronounced at C4-5. Electronically Signed   By: Jeannine Boga M.D.   On: 12/03/2018 23:55   Ct Head Wo Contrast  Result Date: 12/03/2018 CLINICAL DATA:  Fall.  Headache. EXAM: CT HEAD WITHOUT CONTRAST TECHNIQUE: Contiguous axial images were obtained from the base of the skull through the vertex without intravenous contrast. COMPARISON:  10/11/2017 FINDINGS: Brain: There is no evidence for acute hemorrhage, hydrocephalus, mass lesion, or abnormal extra-axial fluid collection. No definite CT evidence for acute infarction. Diffuse loss of parenchymal volume is consistent with atrophy. Patchy low attenuation in the deep hemispheric and periventricular white matter is nonspecific, but likely reflects chronic microvascular ischemic demyelination. Vascular: No hyperdense vessel or unexpected calcification. Skull: No evidence for fracture. No worrisome lytic or sclerotic lesion. Sinuses/Orbits: The visualized paranasal sinuses and mastoid air cells are clear. Visualized portions of the globes and intraorbital fat are unremarkable. Other: None. IMPRESSION: 1. Stable.  No acute intracranial abnormality. 2. Atrophy with chronic small vessel white matter ischemic disease. Electronically Signed   By: Misty Stanley M.D.   On: 12/03/2018 15:33   Ct Angio Neck W And/or Wo Contrast  Result Date: 12/03/2018 CLINICAL DATA:  Initial evaluation for acute headache, dizziness. EXAM: CT ANGIOGRAPHY HEAD AND NECK TECHNIQUE: Multidetector CT imaging of the head and neck was performed using the standard protocol during bolus administration of intravenous contrast. Multiplanar CT image reconstructions and MIPs were obtained to evaluate  the vascular anatomy. Carotid stenosis measurements (when applicable) are obtained utilizing NASCET criteria, using the distal internal carotid diameter as the denominator. CONTRAST:  47mL OMNIPAQUE IOHEXOL 350 MG/ML SOLN COMPARISON:  Prior  head CT from earlier the same day. FINDINGS: CTA NECK FINDINGS Aortic arch: Visualized aortic arch of normal caliber with normal 3 vessel morphology. Mild atheromatous plaque within the lateral aspect of the arch noted. No hemodynamically significant stenosis seen about the origin of the great vessels. Visualized subclavian arteries widely patent. Right carotid system: Right common and internal carotid arteries widely patent without stenosis, dissection, or occlusion. No significant atheromatous narrowing about the right carotid bifurcation. Left carotid system: Left common and internal carotid arteries widely patent without stenosis, dissection or occlusion. No significant atheromatous narrowing about the left carotid bifurcation. Vertebral arteries: Both vertebral arteries arise from the subclavian arteries. Left vertebral slightly dominant. Vertebral arteries widely patent within the neck without stenosis or other acute vascular abnormality. Skeleton: No acute osseous abnormality. No discrete lytic or blastic osseous lesions. Prominent degenerative spondylolysis at C3-4 through C6-7 with partial ankylosis at C4 through C6. Associated prominent posterior disc osteophytes and/or O PLL at these levels with moderate to severe spinal stenosis, most pronounced at C4-5. Other neck: No other acute soft tissue abnormality within the neck. Right parotid gland markedly atrophic and/or absent. Upper chest: Visualized upper chest demonstrates no acute finding. Review of the MIP images confirms the above findings CTA HEAD FINDINGS Anterior circulation: Petrous segments widely patent bilaterally. Mild scattered atheromatous plaque within the carotid siphons without hemodynamically significant stenosis. ICA termini well perfused. A1 segments patent bilaterally. Normal anterior communicating artery. Anterior cerebral arteries widely patent to their distal aspects. No M1 stenosis or occlusion. Distal MCA branches well perfused and  symmetric. Posterior circulation: Vertebral arteries patent to the vertebrobasilar junction without stenosis. Gross patency of the left PICA proximally. Right PICA not seen. Basilar patent to its distal aspect without stenosis. Superior cerebral arteries patent bilaterally. Both of the posterior cerebral arteries primarily supplied via the basilar and are well perfused to their distal aspects. Venous sinuses: Grossly patent allowing for timing of the contrast bolus. Anatomic variants: None significant. No intracranial aneurysm or other vascular abnormality. Delayed phase: Not for para Review of the MIP images confirms the above findings IMPRESSION: 1. Negative CTA of the head and neck. No emergent large vessel occlusion. No dissection, hemodynamically significant stenosis, or other acute vascular abnormality. 2. Mild atheromatous plaque within the carotid siphons without significant stenosis. 3. Moderate to advanced degenerative spondylolysis at C3-4 through C6-7 with resultant moderate to severe spinal stenosis, most pronounced at C4-5. Electronically Signed   By: Jeannine Boga M.D.   On: 12/03/2018 23:55    Procedures Procedures (including critical care time)  Medications Ordered in ED Medications  sodium chloride flush (NS) 0.9 % injection 3 mL (3 mLs Intravenous Given 12/03/18 2308)  meclizine (ANTIVERT) tablet 25 mg (25 mg Oral Given 12/03/18 2246)  iohexol (OMNIPAQUE) 350 MG/ML injection 75 mL (75 mLs Intravenous Contrast Given 12/03/18 2326)  ketorolac (TORADOL) 15 MG/ML injection 15 mg (15 mg Intravenous Given 12/04/18 0003)   Initial Impression / Assessment and Plan / ED Course  I have reviewed the triage vital signs and the nursing notes.  Pertinent labs & imaging results that were available during my care of the patient were reviewed by me and considered in my medical decision making (see chart for details).  73 year old female who appears otherwise well presents for evaluation  of  headache, ear pain and dizziness.  Afebrile, nonseptic, non-ill-appearing. She has a normal neurologic exam without neurologic deficits. Patient with mild cerumen without mastoid tenderness, discharge.  No vesicular lesions to indicate zoster. She denies tinnitus. No neck stiffness or neck rigidity.  No meningismus.  No COVID-19 positive exposures.  No sudden onset thunderclap headache.  Heart and lungs clear.  Given ear pain with headache and dizziness will obtain CTA head and CTA neck.  Work up started in triage. We will plan for reassessment.  If no symptom improvement will plan for MR head.  Labs and imaging personally reviewed:  CT head without acute findings. Labs without acute findings. CTA head and neck without acute findings.  Past medical records reviewed.  Reevaluation with HA and dizziness has resolved with Toradol and meclizine. Requesting dc home. Patient able to ambulate without ataxia. HR in 40-50's however patient states that this is at her baseline. She is followed by PCP for this. Low suspicion for CVA, atypical ACS, dissection, SAH, ICH, Meningitis, or temporal arteritis. Pt is afebrile with no focal neuro deficits, nuchal rigidity, or change in vision.   Patient with negative skew test.  No vertical or rotational nystagmus. Patient normal finger-nose and normal gait.  No slurred speech renal or weakness.    History and physical consistent with peripheral vertigo symptoms.  We'll discharge home with meclizine. Patient instructed to followup with her primary care physician or neurology within 3 days for further evaluation. They are to return to the emergency department for new neurologic symptoms, loss of vision or other concerning symptoms. Discussed COVID testing given rhinorrhea however patient declined.  The patient has been appropriately medically screened and/or stabilized in the ED. I have low suspicion for any other emergent medical condition which would require further  screening, evaluation or treatment in the ED or require inpatient management.  Patient is hemodynamically stable and in no acute distress.  Patient able to ambulate in department prior to ED.  Evaluation does not show acute pathology that would require ongoing or additional emergent interventions while in the emergency department or further inpatient treatment.  I have discussed the diagnosis with the patient and answered all questions.  Patient has no further complaints prior to discharge.  Patient is comfortable with plan discussed in room and is stable for discharge at this time.  I have discussed strict return precautions for returning to the emergency department.  Patient was encouraged to follow-up with PCP/specialist refer to at discharge.     Patient has been discussed with attending physician Dr. Laverta Baltimore who agrees with treatment, plan and disposition. Final Clinical Impressions(s) / ED Diagnoses   Final diagnoses:  Dizziness  Acute nonintractable headache, unspecified headache type    ED Discharge Orders         Ordered    meclizine (ANTIVERT) 25 MG tablet  3 times daily PRN     12/04/18 0136           Cloud Graham A, PA-C 12/04/18 0201    Long, W[facility], MD 12/04/18 [phone]

## 2018-12-04 DIAGNOSIS — R42 Dizziness and giddiness: Secondary | ICD-10-CM | POA: Diagnosis not present

## 2018-12-04 MED ORDER — MECLIZINE HCL 25 MG PO TABS: 25.0000 mg | ORAL_TABLET | Freq: Three times a day (TID) | ORAL | 0 refills | Status: AC | PRN

## 2018-12-04 MED ORDER — SODIUM CHLORIDE 0.9 % IV BOLUS: 500.0000 mL | Freq: Once | INTRAVENOUS | Status: DC

## 2018-12-04 NOTE — Discharge Instructions (Signed)
Your evaluated today for headache and dizziness.  Your imaging and work-up was negative.  I given you a medication to help you with your dizziness.  Continue to take over the next 2 days.  If your symptoms are unresolved follow-up with your PCP.  If you develop worsening symptoms, unilateral weakness, sudden onset thunderclap headache, vision changes return to the emergency department

## 2018-12-04 NOTE — ED Notes (Signed)
Patient ambulated to bathroom with no complaints.  Observed patient ambulate with steady and even gait.

## 2018-12-13 DIAGNOSIS — R51 Headache: Secondary | ICD-10-CM | POA: Diagnosis not present

## 2018-12-13 DIAGNOSIS — E7800 Pure hypercholesterolemia, unspecified: Secondary | ICD-10-CM | POA: Diagnosis not present

## 2018-12-13 DIAGNOSIS — Z6839 Body mass index (BMI) 39.0-39.9, adult: Secondary | ICD-10-CM | POA: Diagnosis not present

## 2018-12-13 DIAGNOSIS — Z7984 Long term (current) use of oral hypoglycemic drugs: Secondary | ICD-10-CM | POA: Diagnosis not present

## 2018-12-13 DIAGNOSIS — I10 Essential (primary) hypertension: Secondary | ICD-10-CM | POA: Diagnosis not present

## 2018-12-13 DIAGNOSIS — H6121 Impacted cerumen, right ear: Secondary | ICD-10-CM | POA: Diagnosis not present

## 2018-12-13 DIAGNOSIS — E1169 Type 2 diabetes mellitus with other specified complication: Secondary | ICD-10-CM | POA: Diagnosis not present

## 2018-12-13 DIAGNOSIS — M47812 Spondylosis without myelopathy or radiculopathy, cervical region: Secondary | ICD-10-CM | POA: Diagnosis not present

## 2018-12-24 DIAGNOSIS — Z961 Presence of intraocular lens: Secondary | ICD-10-CM | POA: Diagnosis not present

## 2018-12-24 DIAGNOSIS — H35371 Puckering of macula, right eye: Secondary | ICD-10-CM | POA: Diagnosis not present

## 2018-12-24 DIAGNOSIS — E119 Type 2 diabetes mellitus without complications: Secondary | ICD-10-CM | POA: Diagnosis not present

## 2018-12-26 DIAGNOSIS — Z20828 Contact with and (suspected) exposure to other viral communicable diseases: Secondary | ICD-10-CM | POA: Diagnosis not present

## 2019-01-01 DIAGNOSIS — M26623 Arthralgia of bilateral temporomandibular joint: Secondary | ICD-10-CM | POA: Diagnosis not present

## 2019-04-17 DIAGNOSIS — K219 Gastro-esophageal reflux disease without esophagitis: Secondary | ICD-10-CM | POA: Diagnosis not present

## 2019-04-17 DIAGNOSIS — E1169 Type 2 diabetes mellitus with other specified complication: Secondary | ICD-10-CM | POA: Diagnosis not present

## 2019-04-17 DIAGNOSIS — I10 Essential (primary) hypertension: Secondary | ICD-10-CM | POA: Diagnosis not present

## 2019-04-17 DIAGNOSIS — Z780 Asymptomatic menopausal state: Secondary | ICD-10-CM | POA: Diagnosis not present

## 2019-04-17 DIAGNOSIS — Z6841 Body Mass Index (BMI) 40.0 and over, adult: Secondary | ICD-10-CM | POA: Diagnosis not present

## 2019-04-17 DIAGNOSIS — Z7984 Long term (current) use of oral hypoglycemic drugs: Secondary | ICD-10-CM | POA: Diagnosis not present

## 2019-04-17 DIAGNOSIS — G4733 Obstructive sleep apnea (adult) (pediatric): Secondary | ICD-10-CM | POA: Diagnosis not present

## 2019-04-17 DIAGNOSIS — Z0000 Encounter for general adult medical examination without abnormal findings: Secondary | ICD-10-CM | POA: Diagnosis not present

## 2019-04-17 DIAGNOSIS — Z23 Encounter for immunization: Secondary | ICD-10-CM | POA: Diagnosis not present

## 2019-04-22 ENCOUNTER — Other Ambulatory Visit: Payer: Self-pay | Admitting: Physician Assistant

## 2019-04-22 DIAGNOSIS — E2839 Other primary ovarian failure: Secondary | ICD-10-CM

## 2019-04-30 ENCOUNTER — Other Ambulatory Visit: Payer: Self-pay

## 2019-04-30 DIAGNOSIS — Z20822 Contact with and (suspected) exposure to covid-19: Secondary | ICD-10-CM

## 2019-04-30 DIAGNOSIS — Z20828 Contact with and (suspected) exposure to other viral communicable diseases: Secondary | ICD-10-CM | POA: Diagnosis not present

## 2019-05-01 LAB — NOVEL CORONAVIRUS, NAA: SARS-CoV-2, NAA: NOT DETECTED

## 2019-05-31 ENCOUNTER — Other Ambulatory Visit: Payer: Self-pay | Admitting: Family Medicine

## 2019-05-31 DIAGNOSIS — Z1231 Encounter for screening mammogram for malignant neoplasm of breast: Secondary | ICD-10-CM

## 2019-07-12 ENCOUNTER — Ambulatory Visit
Admission: RE | Admit: 2019-07-12 | Discharge: 2019-07-12 | Disposition: A | Discharge status: Home / Self Care | Payer: Medicare Other | Source: Ambulatory Visit | Attending: Physician Assistant | Admitting: Physician Assistant

## 2019-07-12 ENCOUNTER — Ambulatory Visit: Payer: Medicare Other | Attending: Internal Medicine

## 2019-07-12 ENCOUNTER — Other Ambulatory Visit: Payer: Self-pay

## 2019-07-12 DIAGNOSIS — Z780 Asymptomatic menopausal state: Secondary | ICD-10-CM | POA: Diagnosis not present

## 2019-07-12 DIAGNOSIS — Z13820 Encounter for screening for osteoporosis: Secondary | ICD-10-CM | POA: Diagnosis not present

## 2019-07-12 DIAGNOSIS — E2839 Other primary ovarian failure: Secondary | ICD-10-CM

## 2019-07-23 ENCOUNTER — Ambulatory Visit

## 2019-08-22 ENCOUNTER — Other Ambulatory Visit: Payer: Self-pay

## 2019-08-22 ENCOUNTER — Ambulatory Visit
Admission: RE | Admit: 2019-08-22 | Discharge: 2019-08-22 | Disposition: A | Discharge status: Home / Self Care | Payer: Medicare Other | Source: Ambulatory Visit | Attending: Family Medicine | Admitting: Family Medicine

## 2019-08-22 DIAGNOSIS — Z1231 Encounter for screening mammogram for malignant neoplasm of breast: Secondary | ICD-10-CM | POA: Diagnosis not present

## 2019-10-03 DIAGNOSIS — M545 Low back pain: Secondary | ICD-10-CM | POA: Diagnosis not present

## 2019-10-03 DIAGNOSIS — M79605 Pain in left leg: Secondary | ICD-10-CM | POA: Diagnosis not present

## 2019-10-03 DIAGNOSIS — M25552 Pain in left hip: Secondary | ICD-10-CM | POA: Diagnosis not present

## 2019-10-04 DIAGNOSIS — M79609 Pain in unspecified limb: Secondary | ICD-10-CM | POA: Diagnosis not present

## 2019-10-04 DIAGNOSIS — M79602 Pain in left arm: Secondary | ICD-10-CM | POA: Diagnosis not present

## 2019-10-07 DIAGNOSIS — M79662 Pain in left lower leg: Secondary | ICD-10-CM | POA: Diagnosis not present

## 2019-10-10 DIAGNOSIS — M48061 Spinal stenosis, lumbar region without neurogenic claudication: Secondary | ICD-10-CM | POA: Diagnosis not present

## 2019-10-10 DIAGNOSIS — Z6841 Body Mass Index (BMI) 40.0 and over, adult: Secondary | ICD-10-CM | POA: Diagnosis not present

## 2019-10-18 DIAGNOSIS — G4733 Obstructive sleep apnea (adult) (pediatric): Secondary | ICD-10-CM | POA: Diagnosis not present

## 2019-10-18 DIAGNOSIS — Z79899 Other long term (current) drug therapy: Secondary | ICD-10-CM | POA: Diagnosis not present

## 2019-10-18 DIAGNOSIS — K219 Gastro-esophageal reflux disease without esophagitis: Secondary | ICD-10-CM | POA: Diagnosis not present

## 2019-10-18 DIAGNOSIS — M47812 Spondylosis without myelopathy or radiculopathy, cervical region: Secondary | ICD-10-CM | POA: Diagnosis not present

## 2019-10-18 DIAGNOSIS — Z6839 Body mass index (BMI) 39.0-39.9, adult: Secondary | ICD-10-CM | POA: Diagnosis not present

## 2019-10-18 DIAGNOSIS — E1169 Type 2 diabetes mellitus with other specified complication: Secondary | ICD-10-CM | POA: Diagnosis not present

## 2019-10-18 DIAGNOSIS — E7800 Pure hypercholesterolemia, unspecified: Secondary | ICD-10-CM | POA: Diagnosis not present

## 2019-10-18 DIAGNOSIS — I10 Essential (primary) hypertension: Secondary | ICD-10-CM | POA: Diagnosis not present

## 2019-10-24 DIAGNOSIS — M48062 Spinal stenosis, lumbar region with neurogenic claudication: Secondary | ICD-10-CM | POA: Diagnosis not present

## 2019-10-24 DIAGNOSIS — M5416 Radiculopathy, lumbar region: Secondary | ICD-10-CM | POA: Diagnosis not present

## 2019-10-29 DIAGNOSIS — M48062 Spinal stenosis, lumbar region with neurogenic claudication: Secondary | ICD-10-CM | POA: Diagnosis not present

## 2019-10-29 DIAGNOSIS — M5416 Radiculopathy, lumbar region: Secondary | ICD-10-CM | POA: Diagnosis not present

## 2019-10-31 DIAGNOSIS — M5416 Radiculopathy, lumbar region: Secondary | ICD-10-CM | POA: Diagnosis not present

## 2019-10-31 DIAGNOSIS — M48062 Spinal stenosis, lumbar region with neurogenic claudication: Secondary | ICD-10-CM | POA: Diagnosis not present

## 2019-11-06 DIAGNOSIS — M48062 Spinal stenosis, lumbar region with neurogenic claudication: Secondary | ICD-10-CM | POA: Diagnosis not present

## 2019-11-06 DIAGNOSIS — M5416 Radiculopathy, lumbar region: Secondary | ICD-10-CM | POA: Diagnosis not present

## 2019-11-07 DIAGNOSIS — M48062 Spinal stenosis, lumbar region with neurogenic claudication: Secondary | ICD-10-CM | POA: Diagnosis not present

## 2019-11-08 DIAGNOSIS — M48062 Spinal stenosis, lumbar region with neurogenic claudication: Secondary | ICD-10-CM | POA: Diagnosis not present

## 2019-11-08 DIAGNOSIS — M5416 Radiculopathy, lumbar region: Secondary | ICD-10-CM | POA: Diagnosis not present

## 2019-11-12 DIAGNOSIS — M48062 Spinal stenosis, lumbar region with neurogenic claudication: Secondary | ICD-10-CM | POA: Diagnosis not present

## 2019-11-12 DIAGNOSIS — M5416 Radiculopathy, lumbar region: Secondary | ICD-10-CM | POA: Diagnosis not present

## 2019-11-14 DIAGNOSIS — M48062 Spinal stenosis, lumbar region with neurogenic claudication: Secondary | ICD-10-CM | POA: Diagnosis not present

## 2019-11-14 DIAGNOSIS — M5416 Radiculopathy, lumbar region: Secondary | ICD-10-CM | POA: Diagnosis not present

## 2019-11-19 DIAGNOSIS — M5416 Radiculopathy, lumbar region: Secondary | ICD-10-CM | POA: Diagnosis not present

## 2019-11-19 DIAGNOSIS — M48062 Spinal stenosis, lumbar region with neurogenic claudication: Secondary | ICD-10-CM | POA: Diagnosis not present

## 2019-11-29 DIAGNOSIS — M48062 Spinal stenosis, lumbar region with neurogenic claudication: Secondary | ICD-10-CM | POA: Diagnosis not present

## 2019-11-29 DIAGNOSIS — M5416 Radiculopathy, lumbar region: Secondary | ICD-10-CM | POA: Diagnosis not present

## 2019-12-02 DIAGNOSIS — M48062 Spinal stenosis, lumbar region with neurogenic claudication: Secondary | ICD-10-CM | POA: Diagnosis not present

## 2019-12-02 DIAGNOSIS — M5416 Radiculopathy, lumbar region: Secondary | ICD-10-CM | POA: Diagnosis not present

## 2019-12-04 DIAGNOSIS — G4733 Obstructive sleep apnea (adult) (pediatric): Secondary | ICD-10-CM | POA: Diagnosis not present

## 2020-02-27 ENCOUNTER — Ambulatory Visit: Payer: Medicare Other | Attending: Internal Medicine

## 2020-02-27 DIAGNOSIS — Z23 Encounter for immunization: Secondary | ICD-10-CM

## 2020-02-27 NOTE — Progress Notes (Signed)
   Covid-19 Vaccination Clinic  Name:  Kylie Brown    MRN: [id number] DOB: 12/29/1945  02/27/2020  [name] was observed post Covid-19 immunization for 15 minutes without incident. She was provided with Vaccine Information Sheet and instruction to access the V-Safe system.   [name] was instructed to call 911 with any severe reactions post vaccine: Marland Kitchen Difficulty breathing  . Swelling of face and throat  . A fast heartbeat  . A bad rash all over body  . Dizziness and weakness

## 2020-04-06 DIAGNOSIS — M542 Cervicalgia: Secondary | ICD-10-CM | POA: Diagnosis not present

## 2020-04-06 DIAGNOSIS — M5030 Other cervical disc degeneration, unspecified cervical region: Secondary | ICD-10-CM | POA: Diagnosis not present

## 2020-04-06 DIAGNOSIS — M48061 Spinal stenosis, lumbar region without neurogenic claudication: Secondary | ICD-10-CM | POA: Diagnosis not present

## 2020-05-04 DIAGNOSIS — E1169 Type 2 diabetes mellitus with other specified complication: Secondary | ICD-10-CM | POA: Diagnosis not present

## 2020-05-04 DIAGNOSIS — M47812 Spondylosis without myelopathy or radiculopathy, cervical region: Secondary | ICD-10-CM | POA: Diagnosis not present

## 2020-05-04 DIAGNOSIS — I10 Essential (primary) hypertension: Secondary | ICD-10-CM | POA: Diagnosis not present

## 2020-05-04 DIAGNOSIS — Z6841 Body Mass Index (BMI) 40.0 and over, adult: Secondary | ICD-10-CM | POA: Diagnosis not present

## 2020-05-04 DIAGNOSIS — Z23 Encounter for immunization: Secondary | ICD-10-CM | POA: Diagnosis not present

## 2020-05-04 DIAGNOSIS — Z0000 Encounter for general adult medical examination without abnormal findings: Secondary | ICD-10-CM | POA: Diagnosis not present

## 2020-05-04 DIAGNOSIS — E7800 Pure hypercholesterolemia, unspecified: Secondary | ICD-10-CM | POA: Diagnosis not present

## 2020-06-23 ENCOUNTER — Other Ambulatory Visit: Payer: Medicare Other

## 2020-06-23 DIAGNOSIS — Z20822 Contact with and (suspected) exposure to covid-19: Secondary | ICD-10-CM | POA: Diagnosis not present

## 2020-06-24 LAB — NOVEL CORONAVIRUS, NAA: SARS-CoV-2, NAA: NOT DETECTED

## 2020-07-21 ENCOUNTER — Other Ambulatory Visit: Payer: Self-pay | Admitting: Family Medicine

## 2020-07-21 DIAGNOSIS — Z1231 Encounter for screening mammogram for malignant neoplasm of breast: Secondary | ICD-10-CM

## 2020-09-02 ENCOUNTER — Ambulatory Visit: Payer: Medicare Other

## 2020-09-28 ENCOUNTER — Ambulatory Visit
Admission: RE | Admit: 2020-09-28 | Discharge: 2020-09-28 | Disposition: A | Discharge status: Home / Self Care | Payer: Medicare HMO | Source: Ambulatory Visit | Attending: Family Medicine | Admitting: Family Medicine

## 2020-09-28 ENCOUNTER — Other Ambulatory Visit: Payer: Self-pay

## 2020-09-28 DIAGNOSIS — Z1231 Encounter for screening mammogram for malignant neoplasm of breast: Secondary | ICD-10-CM

## 2020-10-23 ENCOUNTER — Other Ambulatory Visit (HOSPITAL_BASED_OUTPATIENT_CLINIC_OR_DEPARTMENT_OTHER): Payer: Self-pay

## 2020-10-23 ENCOUNTER — Other Ambulatory Visit: Payer: Self-pay

## 2020-10-23 ENCOUNTER — Ambulatory Visit: Payer: Medicare HMO | Attending: Internal Medicine

## 2020-10-23 DIAGNOSIS — Z23 Encounter for immunization: Secondary | ICD-10-CM

## 2020-10-23 MED ORDER — PFIZER-BIONT COVID-19 VAC-TRIS 30 MCG/0.3ML IM SUSP
INTRAMUSCULAR | 0 refills | Status: DC
  Filled 2020-10-23: qty 0.3, 1d supply, fill #0

## 2020-10-23 NOTE — Progress Notes (Signed)
   Covid-19 Vaccination Clinic  Name:  Kylie Brown    MRN: [id number] DOB: 1945/09/01  10/23/2020  [name] was observed post Covid-19 immunization for 15 minutes without incident. She was provided with Vaccine Information Sheet and instruction to access the V-Safe system.   [name] was instructed to call 911 with any severe reactions post vaccine: Marland Kitchen Difficulty breathing  . Swelling of face and throat  . A fast heartbeat  . A bad rash all over body  . Dizziness and weakness   Immunizations Administered    Name Date Dose VIS Date Route   PFIZER Comrnaty(Gray TOP) Covid-19 Vaccine 10/23/2020 10:39 AM 0.3 mL 06/04/2020 Intramuscular   Manufacturer: [employer], [employer]   Lot: [id number]   NDC: [phone]

## 2020-11-11 ENCOUNTER — Encounter: Payer: Self-pay | Admitting: Obstetrics

## 2020-11-11 ENCOUNTER — Ambulatory Visit (INDEPENDENT_AMBULATORY_CARE_PROVIDER_SITE_OTHER): Payer: Medicare HMO | Admitting: Obstetrics

## 2020-11-11 ENCOUNTER — Other Ambulatory Visit: Payer: Self-pay

## 2020-11-11 VITALS — BP 145/76 | HR 56 | Ht 66.0 in | Wt 249.0 lb

## 2020-11-11 DIAGNOSIS — N8111 Cystocele, midline: Secondary | ICD-10-CM

## 2020-11-11 DIAGNOSIS — Z01419 Encounter for gynecological examination (general) (routine) without abnormal findings: Secondary | ICD-10-CM

## 2020-11-11 DIAGNOSIS — Z90710 Acquired absence of both cervix and uterus: Secondary | ICD-10-CM

## 2020-11-11 DIAGNOSIS — Z780 Asymptomatic menopausal state: Secondary | ICD-10-CM | POA: Diagnosis not present

## 2020-11-11 DIAGNOSIS — N393 Stress incontinence (female) (male): Secondary | ICD-10-CM

## 2020-11-11 NOTE — Progress Notes (Signed)
Subjective:        Kylie Brown is a 75 y.o. female here for a routine exam.  Current complaints: Leaking of urine with cough, sneeze, etc.    Personal health questionnaire:  Is patient Ashkenazi Jewish, have a family history of breast and/or ovarian cancer: yes Is there a family history of uterine cancer diagnosed at age < 78, gastrointestinal cancer, urinary tract cancer, family member who is a Field seismologist syndrome-associated carrier: no Is the patient overweight and hypertensive, family history of diabetes, personal history of gestational diabetes, preeclampsia or PCOS: no Is patient over 75, have PCOS,  family history of premature CHD under age 43, diabetes, smoke, have hypertension or peripheral artery disease:  no At any time, has a partner hit, kicked or otherwise hurt or frightened you?: no Over the past 2 weeks, have you felt down, depressed or hopeless?: no Over the past 2 weeks, have you felt little interest or pleasure in doing things?:no   Gynecologic History No LMP recorded. Patient is postmenopausal. Contraception: status post hysterectomy Last Pap:> 5 years ago ( s/p hysterectomy ). Results were: normal Last mammogram: 09-28-2020. Results were: normal  Obstetric History OB History  Gravida Para Term Preterm AB Living  2       1 1   SAB IAB Ectopic Multiple Live Births  1       1    # Outcome Date GA Lbr Len/2nd Weight Sex Delivery Anes PTL Lv  2 Gravida 05/21/69    F Vag-Spont   LIV  1 SAB             Past Medical History:  Diagnosis Date  . Arthritis   . Bradycardia   . Diabetes ([place])   . Hypertension   . Sleep apnea     Past Surgical History:  Procedure Laterality Date  . BACK SURGERY  1975  . REPLACEMENT TOTAL KNEE BILATERAL       Current Outpatient Medications:  .  amLODipine (NORVASC) 5 MG tablet, Take 5 mg by mouth daily., Disp: , Rfl:  .  aspirin 81 MG EC tablet, Take 81 mg by mouth daily. Swallow whole., Disp: , Rfl:  .   cetirizine (ZYRTEC) 5 MG tablet, Take 5 mg by mouth daily., Disp: , Rfl:  .  cholecalciferol (VITAMIN D3) 25 MCG (1000 UNIT) tablet, Take 1,000 Units by mouth daily., Disp: , Rfl:  .  esomeprazole (NEXIUM) 40 MG capsule, Take 40 mg by mouth daily at 12 noon., Disp: , Rfl:  .  fosinopril (MONOPRIL) 20 MG tablet, Take 20 mg by mouth daily., Disp: , Rfl:  .  hydrochlorothiazide (HYDRODIURIL) 12.5 MG tablet, Take 12.5 mg by mouth daily., Disp: , Rfl:  .  meclizine (ANTIVERT) 25 MG tablet, Take 1 tablet (25 mg total) by mouth 3 (three) times daily as needed for dizziness., Disp: 30 tablet, Rfl: 0 .  Omega-3 Fatty Acids (FISH OIL) 1000 MG CAPS, Take by mouth., Disp: , Rfl:  .  rosuvastatin (CRESTOR) 5 MG tablet, Take 5 mg by mouth daily., Disp: , Rfl:  .  COVID-19 mRNA Vac-TriS, Pfizer, (PFIZER-BIONT COVID-19 VAC-TRIS) SUSP injection, Inject into the muscle., Disp: 0.3 mL, Rfl: 0 No Known Allergies  Social History   Tobacco Use  . Smoking status: Never Smoker  . Smokeless tobacco: Never Used  Substance Use Topics  . Alcohol use: Not Currently    Family History  Problem Relation Age of Onset  . Heart failure Mother   .  Hypertension Mother   . Diabetes Mother   . Bone cancer Father   . Breast cancer Sister        lost left Breast   . Lung cancer Brother       Review of Systems  Constitutional: negative for fatigue and weight loss Respiratory: negative for cough and wheezing Cardiovascular: negative for chest pain, fatigue and palpitations Gastrointestinal: negative for abdominal pain and change in bowel habits Musculoskeletal:negative for myalgias Neurological: negative for gait problems and tremors Behavioral/Psych: negative for abusive relationship, depression Endocrine: negative for temperature intolerance    Genitourinary:negative for abnormal menstrual periods, genital lesions, hot flashes, sexual problems and vaginal discharge.  Leaking of urine with cough, sneeze,  etc Integument/breast: negative for breast lump, breast tenderness, nipple discharge and skin lesion(s)    Objective:       BP (!) 145/76   Pulse (!) 56   Ht 5\' 6"  (1.676 m)   Wt 249 lb (112.9 kg)   BMI 40.19 kg/m  General:   alert and no distress  Skin:   no rash or abnormalities  Lungs:   clear to auscultation bilaterally  Heart:   regular rate and rhythm, S1, S2 normal, no murmur, click, rub or gallop  Breasts:   normal without suspicious masses, skin or nipple changes or axillary nodes  Abdomen:  normal findings: no organomegaly, soft, non-tender and no hernia  Pelvis:  External genitalia: normal general appearance Urinary system: urethral meatus normal and bladder without fullness, nontender Vaginal: midline grade 1 cystocele Cervix:  absent Adnexa: normal bimanual exam Uterus: absent   Lab Review Urine pregnancy test Labs reviewed yes Radiologic studies reviewed yes  I have spent a total of 20 minutes of face-to-face time, excluding clinical staff time, reviewing notes and preparing to see patient, ordering tests and/or medications, and counseling the patient.  Assessment:     1. Encounter for routine gynecologic examination in Medicare patient  2. Postmenopause - doing well  3. Status post hysterectomy  4. Cystocele, midline - asymptomatic.  Will follow clinically.  5. SUI (stress urinary incontinence, female) - mild.  Will follow.    Plan:    Education reviewed: calcium supplements, depression evaluation, low fat, low cholesterol diet, safe sex/STD prevention, self breast exams and weight bearing exercise. Follow up in: 1 year.     Shelly Bombard, MD 11/11/2020 10:22 AM

## 2020-11-11 NOTE — Progress Notes (Signed)
NGYN patient presents for Annual Exam.  Last pap: > 24yrs  Mammogram:09/29/20 WNL  Family Hx of Breast Cancer: Sister   CC: pt notes pelvic pain and lesions/growths on the top of pelvic area and they itch. Pt states lesions have been present maybe x 2 yrs.

## 2020-12-01 IMAGING — CT CT HEAD WITHOUT CONTRAST
4 series · 16 of 47 positions shown, 18 images · non-contrast
Comparison: 10/11/2017

CLINICAL DATA: Fall.  Headache.

EXAM:
CT HEAD WITHOUT CONTRAST
TECHNIQUE: Contiguous axial images were obtained from the base of the skull
through the vertex without intravenous contrast.

[Series 3: head without · axial · non-contrast · 0.48mm/px · z∈[-110,+10]mm · 7 of 34 slices shown, 9 images]
[im 5/34  brain]
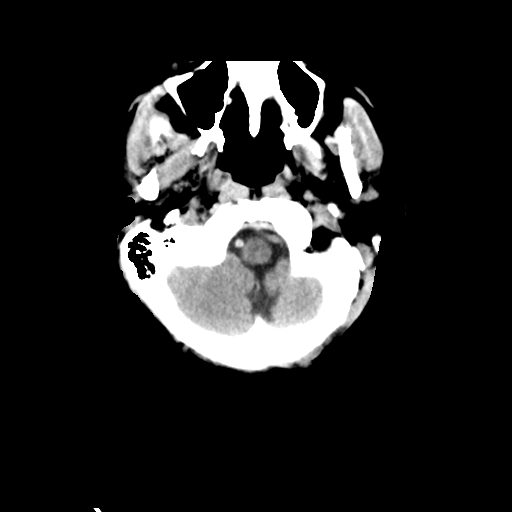
[im 5/34  bone]
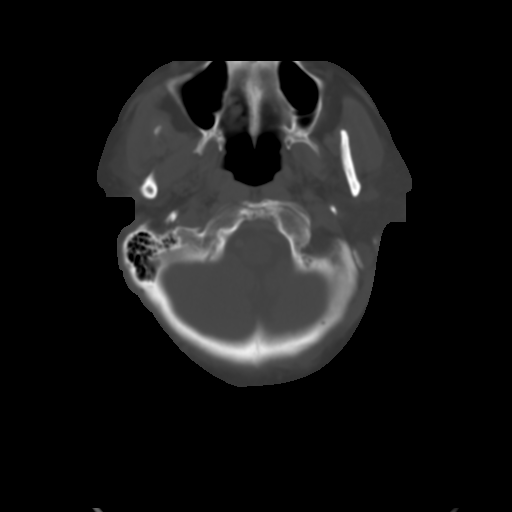
[im 9/34  brain]
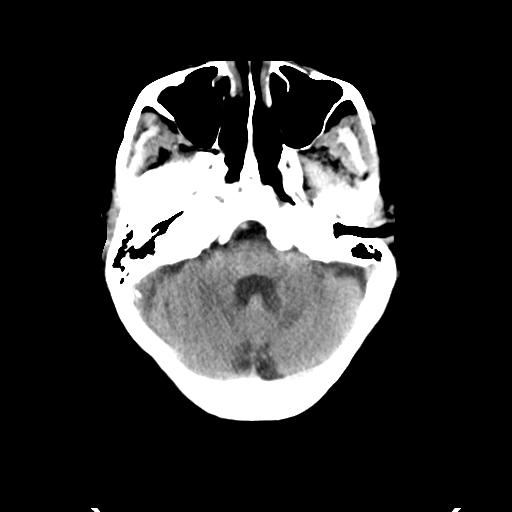
[im 13/34  brain]
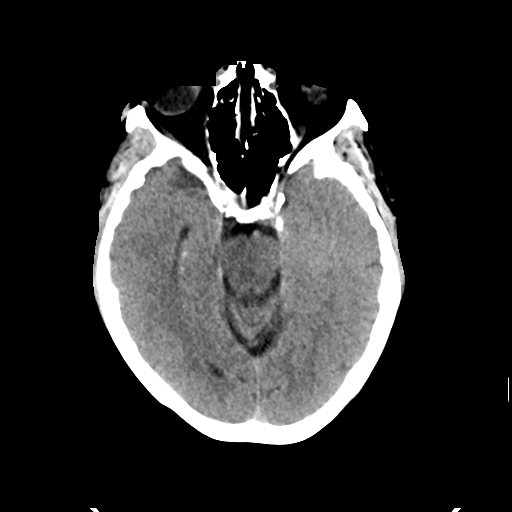
[im 17/34  brain]
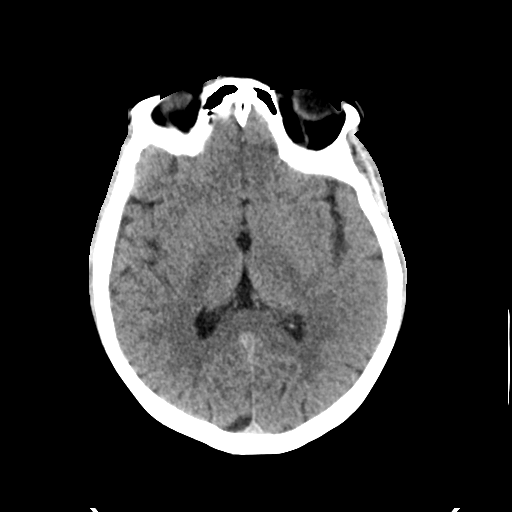
[im 21/34  brain]
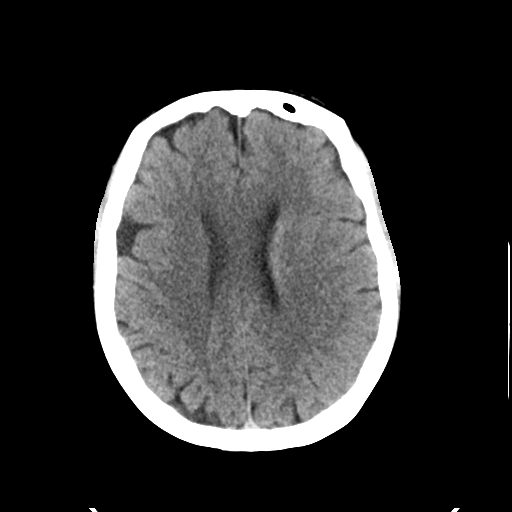
[im 21/34  bone]
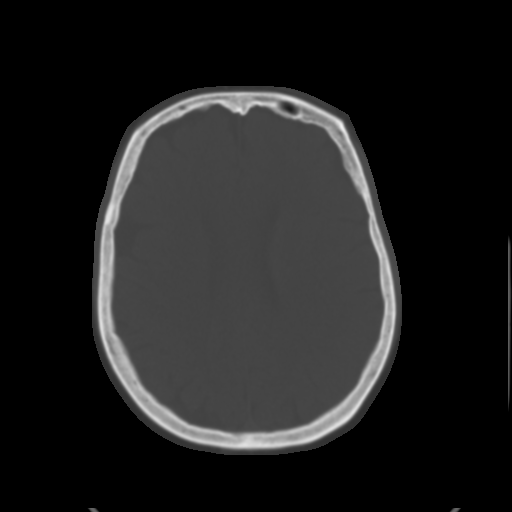
[im 25/34  brain]
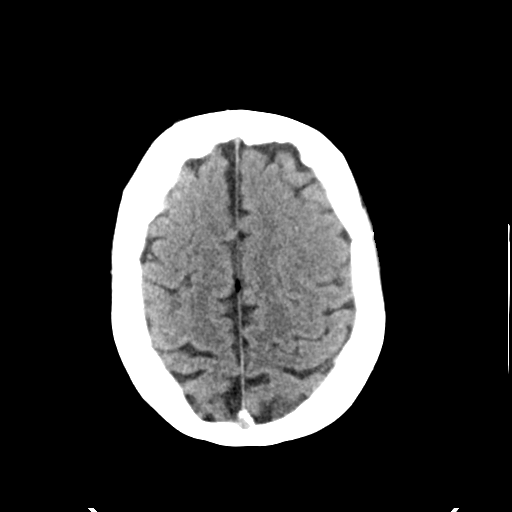
[im 29/34  brain]
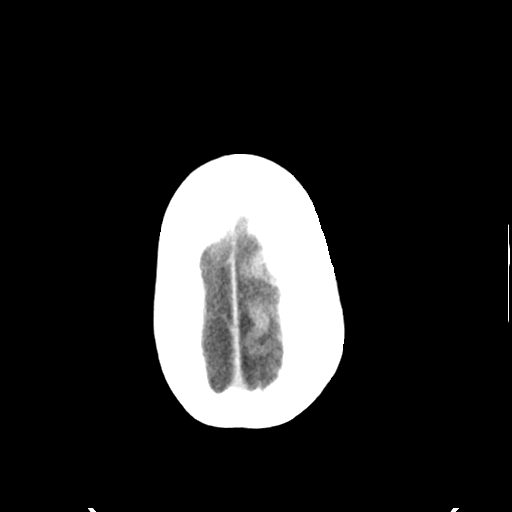

[Series 4: head bone · axial · 0.48mm/px · z∈[-114,-80]mm · 3 of 85 slices shown]
[im 9/85  bone]
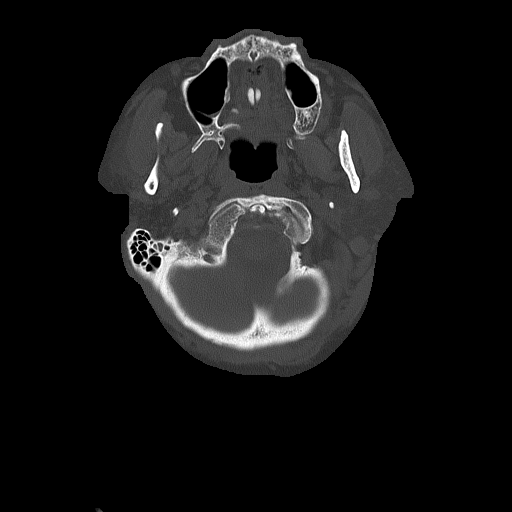
[im 17/85  bone]
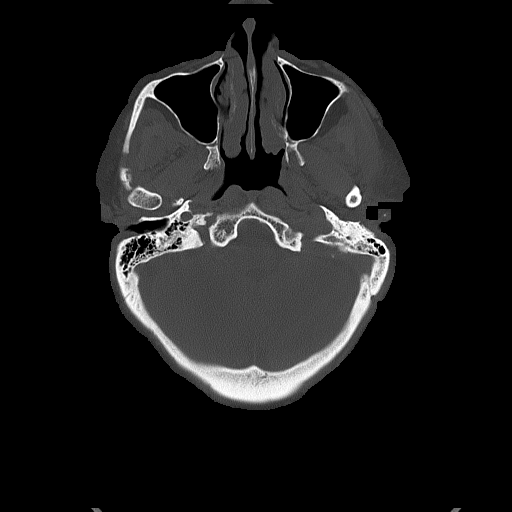
[im 26/85  bone]
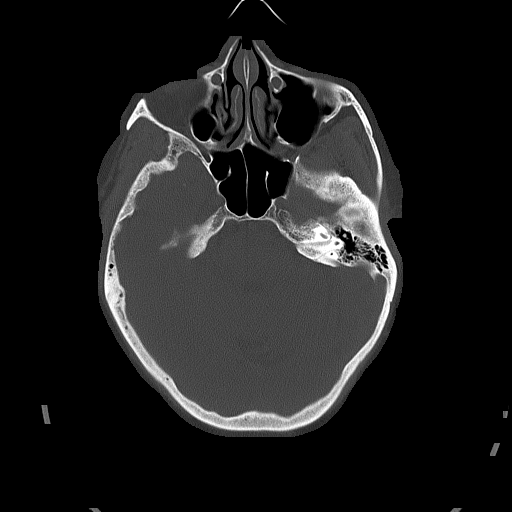

[Series 5: head without cor · coronal · non-contrast · 0.32mm/px · 3 of 67 slices shown]
[im 23/67  brain]
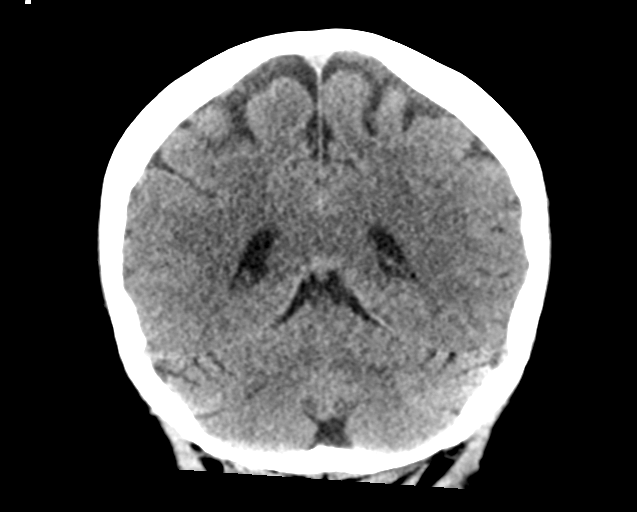
[im 30/67  brain]
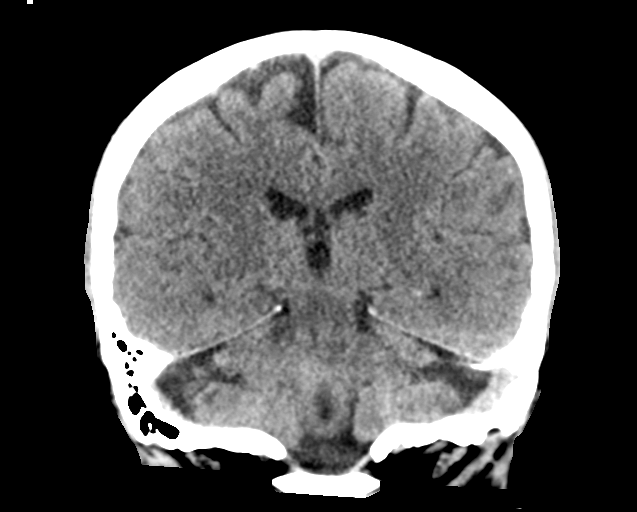
[im 37/67  brain]
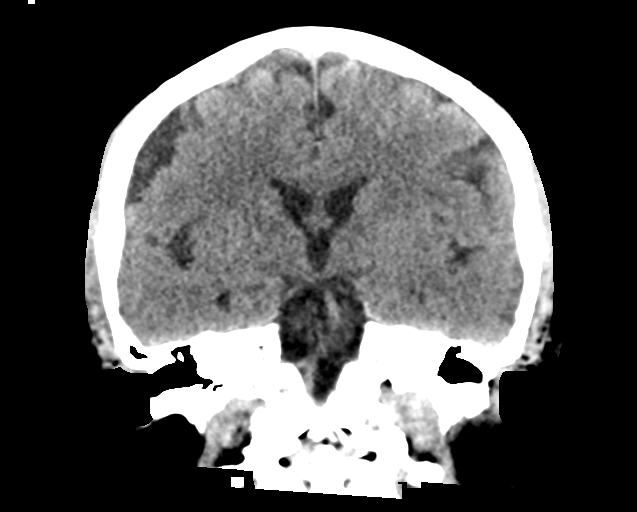

[Series 6: head without sag · sagittal · non-contrast · 0.33mm/px · 3 of 67 slices shown]
[im 23/67  brain]
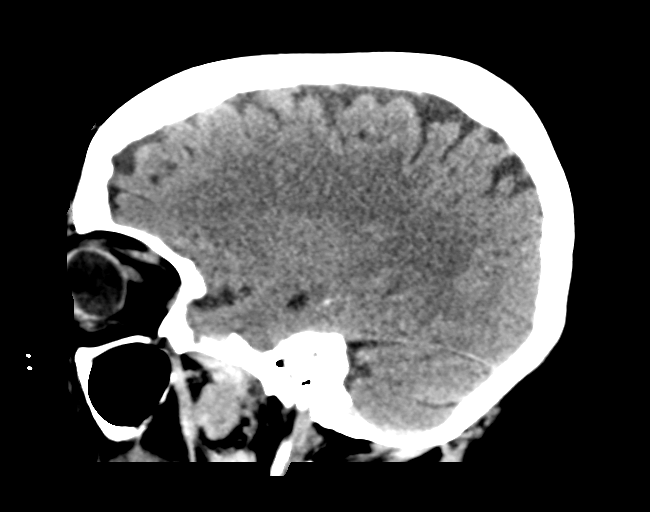
[im 34/67  brain]
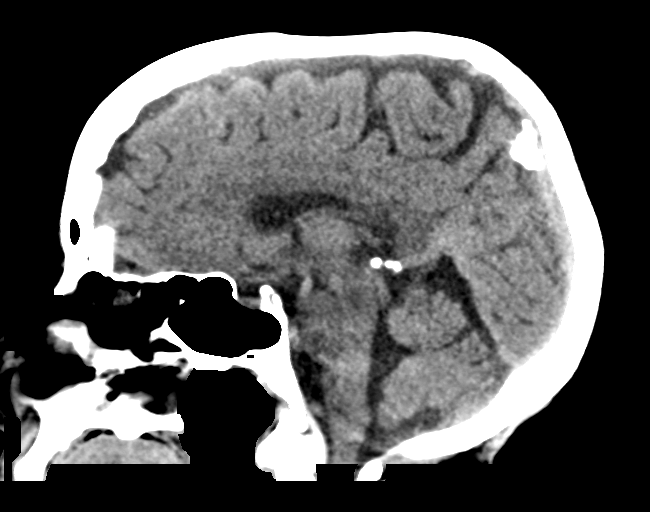
[im 45/67  brain]
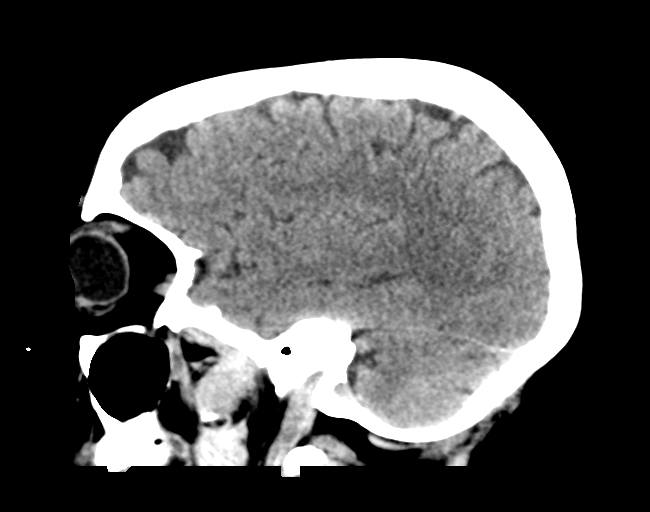

[16 of 47 positions shown; findings below may reference images not displayed]

FINDINGS: Brain: There is no evidence for acute hemorrhage, hydrocephalus,
mass lesion, or abnormal extra-axial fluid collection. No definite
CT evidence for acute infarction. Diffuse loss of parenchymal volume
is consistent with atrophy. Patchy low attenuation in the deep
hemispheric and periventricular white matter is nonspecific, but
likely reflects chronic microvascular ischemic demyelination.

Vascular: No hyperdense vessel or unexpected calcification.

Skull: No evidence for fracture. No worrisome lytic or sclerotic
lesion.

Sinuses/Orbits: The visualized paranasal sinuses and mastoid air
cells are clear. Visualized portions of the globes and intraorbital
fat are unremarkable.

Other: None.
IMPRESSION: 1. Stable.  No acute intracranial abnormality.
2. Atrophy with chronic small vessel white matter ischemic disease.

## 2020-12-01 IMAGING — CT CT ANGIOGRAPHY NECK
3 of 7 series · 10 of 36 positions shown · IV contrast (OMNI 350)
Comparison: Prior head CT from earlier the same day.

CLINICAL DATA: Initial evaluation for acute headache, dizziness.

EXAM:
CT ANGIOGRAPHY HEAD AND NECK
TECHNIQUE: Multidetector CT imaging of the head and neck was performed using
the standard protocol during bolus administration of intravenous
contrast. Multiplanar CT image reconstructions and MIPs were
obtained to evaluate the vascular anatomy. Carotid stenosis
measurements (when applicable) are obtained utilizing NASCET
criteria, using the distal internal carotid diameter as the
denominator.
CONTRAST:  75mL OMNIPAQUE IOHEXOL 350 MG/ML SOLN

[Series 5: cta neck · axial · 0.47mm/px · z∈[-715,-601]mm · 2 of 172 slices shown]
[im 58/172  soft-tissue]
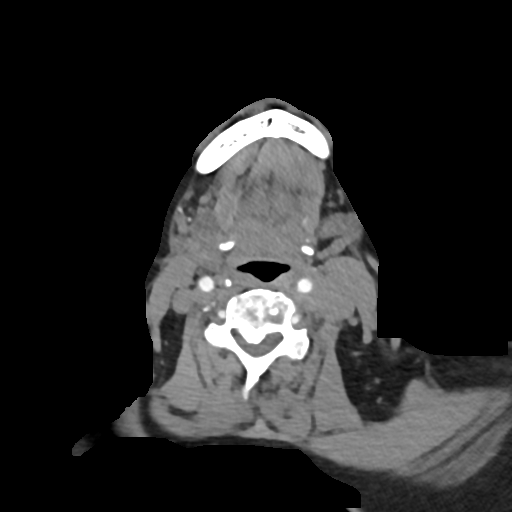
[im 115/172  bone]
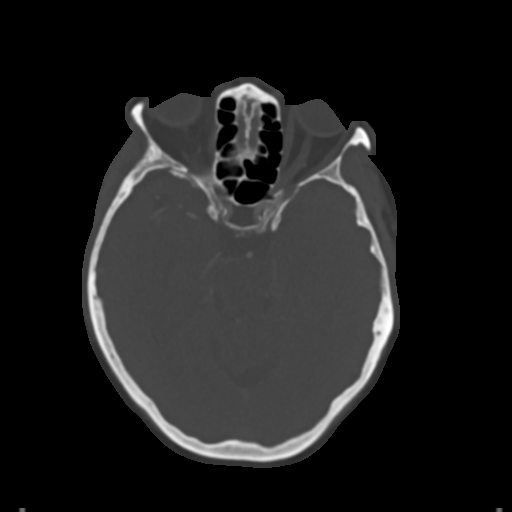

[Series 7: cta neck axial · axial · 0.39mm/px · z∈[-805,-574]mm · 6 of 329 slices shown]
[im 47/329  soft-tissue]
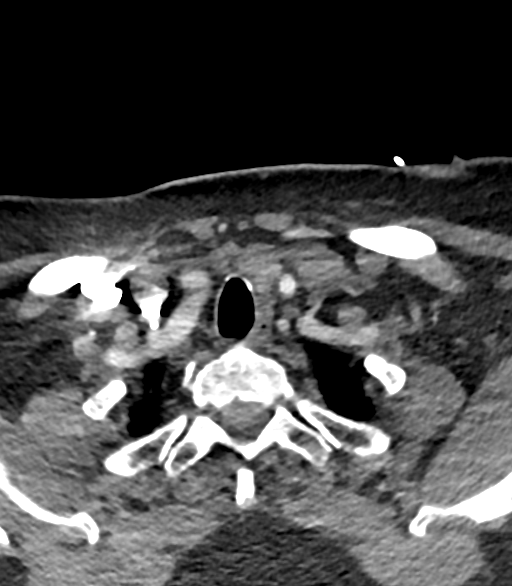
[im 94/329  soft-tissue]
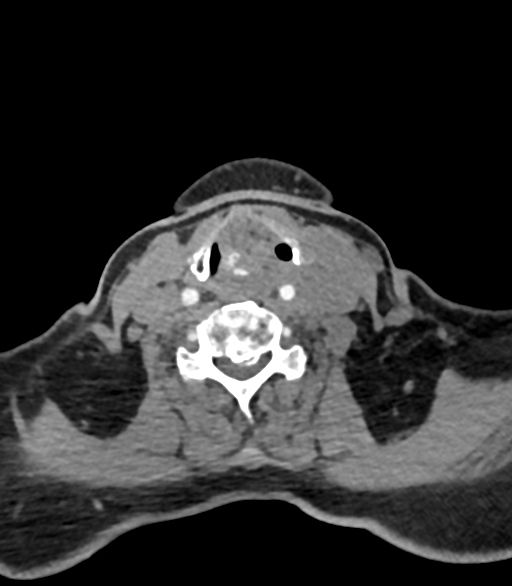
[im 141/329  soft-tissue]
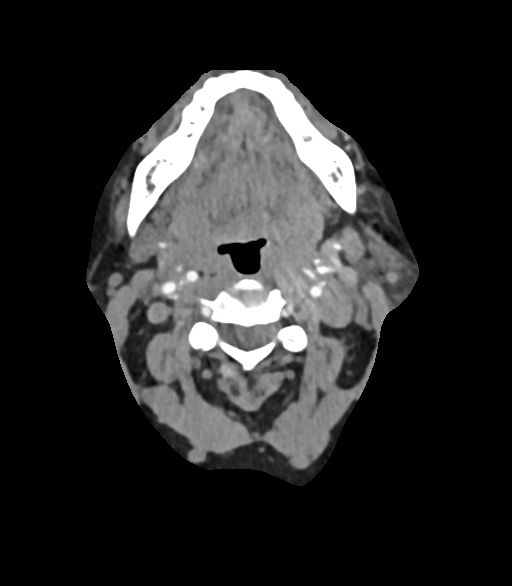
[im 188/329  soft-tissue]
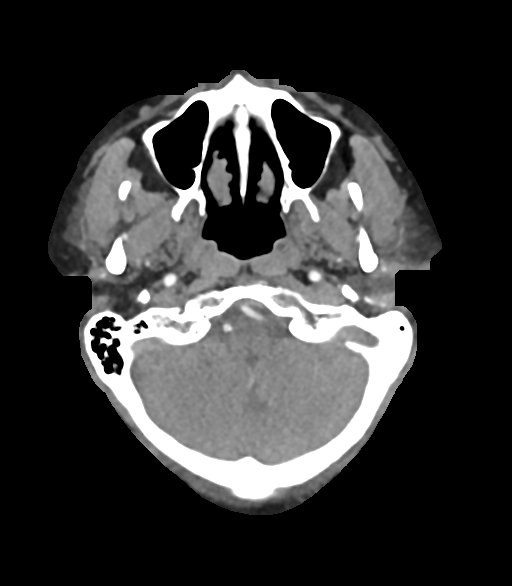
[im 235/329  soft-tissue]
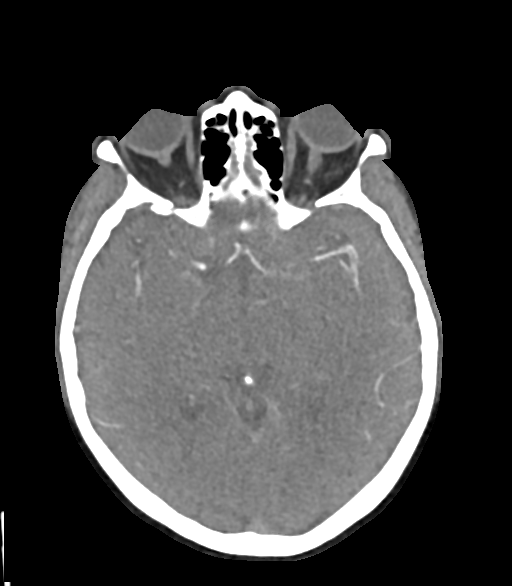
[im 282/329  soft-tissue]
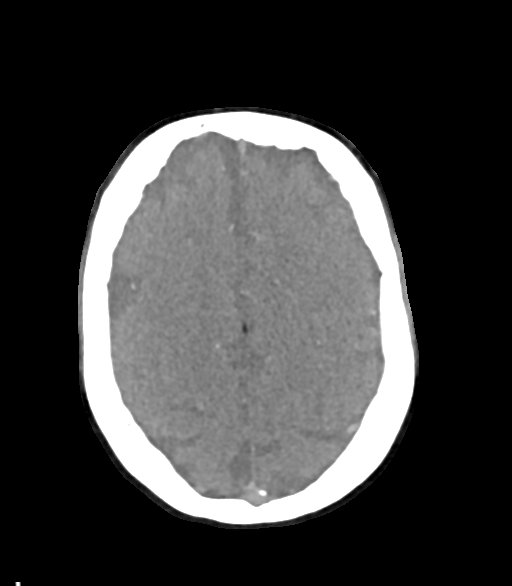

[Series 9: cta neck sagittal · sagittal · 0.45mm/px · 2 of 201 slices shown]
[im 59/201  soft-tissue]
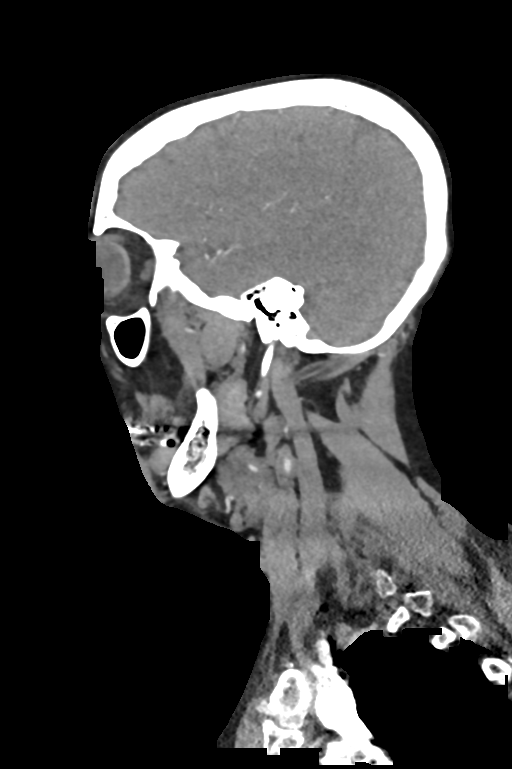
[im 143/201  soft-tissue]
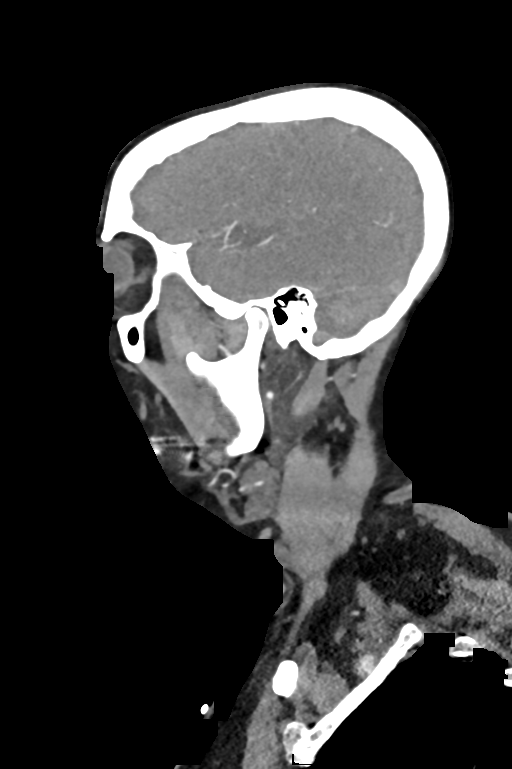

[10 of 36 positions shown; findings below may reference images not displayed]

FINDINGS: CTA NECK FINDINGS

Aortic arch: Visualized aortic arch of normal caliber with normal 3
vessel morphology. Mild atheromatous plaque within the lateral
aspect of the arch noted. No hemodynamically significant stenosis
seen about the origin of the great vessels. Visualized subclavian
arteries widely patent.

Right carotid system: Right common and internal carotid arteries
widely patent without stenosis, dissection, or occlusion. No
significant atheromatous narrowing about the right carotid
bifurcation.

Left carotid system: Left common and internal carotid arteries
widely patent without stenosis, dissection or occlusion. No
significant atheromatous narrowing about the left carotid
bifurcation.

Vertebral arteries: Both vertebral arteries arise from the
subclavian arteries. Left vertebral slightly dominant. Vertebral
arteries widely patent within the neck without stenosis or other
acute vascular abnormality.

Skeleton: No acute osseous abnormality. No discrete lytic or blastic
osseous lesions. Prominent degenerative spondylolysis at C3-4
through C6-7 with partial ankylosis at C4 through C6. Associated
prominent posterior disc osteophytes and/or O PLL at these levels
with moderate to severe spinal stenosis, most pronounced at C4-5.

Other neck: No other acute soft tissue abnormality within the neck.
Right parotid gland markedly atrophic and/or absent.

Upper chest: Visualized upper chest demonstrates no acute finding.

Review of the MIP images confirms the above findings

CTA HEAD FINDINGS

Anterior circulation: Petrous segments widely patent bilaterally.
Mild scattered atheromatous plaque within the carotid siphons
without hemodynamically significant stenosis. ICA termini well
perfused. A1 segments patent bilaterally. Normal anterior
communicating artery. Anterior cerebral arteries widely patent to
their distal aspects. No M1 stenosis or occlusion. Distal MCA
branches well perfused and symmetric.

Posterior circulation: Vertebral arteries patent to the
vertebrobasilar junction without stenosis. Gross patency of the left
PICA proximally. Right PICA not seen. Basilar patent to its distal
aspect without stenosis. Superior cerebral arteries patent
bilaterally. Both of the posterior cerebral arteries primarily
supplied via the basilar and are well perfused to their distal
aspects.

Venous sinuses: Grossly patent allowing for timing of the contrast
bolus.

Anatomic variants: None significant. No intracranial aneurysm or
other vascular abnormality.

Delayed phase: Not for para

Review of the MIP images confirms the above findings
IMPRESSION: 1. Negative CTA of the head and neck. No emergent large vessel
occlusion. No dissection, hemodynamically significant stenosis, or
other acute vascular abnormality.
2. Mild atheromatous plaque within the carotid siphons without
significant stenosis.
3. Moderate to advanced degenerative spondylolysis at C3-4 through
C6-7 with resultant moderate to severe spinal stenosis, most
pronounced at C4-5.

## 2020-12-30 ENCOUNTER — Emergency Department (HOSPITAL_BASED_OUTPATIENT_CLINIC_OR_DEPARTMENT_OTHER)
Admission: EM | Admit: 2020-12-30 | Discharge: 2020-12-30 | Disposition: A | Discharge status: Home / Self Care | Payer: Medicare HMO | Attending: Emergency Medicine | Admitting: Emergency Medicine

## 2020-12-30 ENCOUNTER — Other Ambulatory Visit: Payer: Self-pay

## 2020-12-30 ENCOUNTER — Emergency Department (HOSPITAL_BASED_OUTPATIENT_CLINIC_OR_DEPARTMENT_OTHER): Payer: Medicare HMO | Admitting: Radiology

## 2020-12-30 ENCOUNTER — Encounter (HOSPITAL_BASED_OUTPATIENT_CLINIC_OR_DEPARTMENT_OTHER): Payer: Self-pay | Admitting: Emergency Medicine

## 2020-12-30 DIAGNOSIS — Z79899 Other long term (current) drug therapy: Secondary | ICD-10-CM | POA: Insufficient documentation

## 2020-12-30 DIAGNOSIS — J069 Acute upper respiratory infection, unspecified: Secondary | ICD-10-CM | POA: Insufficient documentation

## 2020-12-30 DIAGNOSIS — I10 Essential (primary) hypertension: Secondary | ICD-10-CM | POA: Insufficient documentation

## 2020-12-30 DIAGNOSIS — Z7982 Long term (current) use of aspirin: Secondary | ICD-10-CM | POA: Diagnosis not present

## 2020-12-30 DIAGNOSIS — Z20822 Contact with and (suspected) exposure to covid-19: Secondary | ICD-10-CM | POA: Insufficient documentation

## 2020-12-30 DIAGNOSIS — R059 Cough, unspecified: Secondary | ICD-10-CM | POA: Diagnosis present

## 2020-12-30 DIAGNOSIS — E119 Type 2 diabetes mellitus without complications: Secondary | ICD-10-CM | POA: Diagnosis not present

## 2020-12-30 DIAGNOSIS — Z96659 Presence of unspecified artificial knee joint: Secondary | ICD-10-CM | POA: Insufficient documentation

## 2020-12-30 LAB — BASIC METABOLIC PANEL
Anion gap: 9 (ref 5–15)
BUN: 13 mg/dL (ref 8–23)
CO2: 25 mmol/L (ref 22–32)
Calcium: 9.6 mg/dL (ref 8.9–10.3)
Chloride: 106 mmol/L (ref 98–111)
Creatinine, Ser: 0.57 mg/dL (ref 0.44–1.00)
GFR, Estimated: >60 mL/min (ref >60)
Glucose, Bld: 84 mg/dL (ref 70–99)
Potassium: 3.8 mmol/L (ref 3.5–5.1)
Sodium: 140 mmol/L (ref 135–145)

## 2020-12-30 LAB — CBC WITH DIFFERENTIAL/PLATELET
Abs Immature Granulocytes: 0.02 10*3/uL (ref 0.00–0.07)
Basophils Absolute: 0.1 10*3/uL (ref 0.0–0.1)
Basophils Relative: 1 %
Eosinophils Absolute: 0.2 10*3/uL (ref 0.0–0.5)
Eosinophils Relative: 2 %
HCT: 35.2 % — ABNORMAL LOW (ref 36.0–46.0)
Hemoglobin: 11.5 g/dL — ABNORMAL LOW (ref 12.0–15.0)
Immature Granulocytes: 0 %
Lymphocytes Relative: 48 %
Lymphs Abs: 3.2 10*3/uL (ref 0.7–4.0)
MCH: 30.3 pg (ref 26.0–34.0)
MCHC: 32.7 g/dL (ref 30.0–36.0)
MCV: 92.9 fL (ref 80.0–100.0)
Monocytes Absolute: 0.6 10*3/uL (ref 0.1–1.0)
Monocytes Relative: 9 %
Neutro Abs: 2.7 10*3/uL (ref 1.7–7.7)
Neutrophils Relative %: 40 %
Platelets: 248 10*3/uL (ref 150–400)
RBC: 3.79 MIL/uL — ABNORMAL LOW (ref 3.87–5.11)
RDW: 12.2 % (ref 11.5–15.5)
WBC: 6.7 10*3/uL (ref 4.0–10.5)
nRBC: 0 % (ref 0.0–0.2)

## 2020-12-30 LAB — RESP PANEL BY RT-PCR (FLU A&B, COVID) ARPGX2
Influenza A by PCR: NEGATIVE
Influenza B by PCR: NEGATIVE
SARS Coronavirus 2 by RT PCR: NEGATIVE

## 2020-12-30 LAB — TROPONIN I (HIGH SENSITIVITY): Troponin I (High Sensitivity): 5 ng/L (ref <18)

## 2020-12-30 LAB — GROUP A STREP BY PCR: Group A Strep by PCR: NOT DETECTED

## 2020-12-30 MED ORDER — ALUM & MAG HYDROXIDE-SIMETH 200-200-20 MG/5ML PO SUSP
30.0000 mL | Freq: Once | ORAL | Status: AC
  Administered 2020-12-30: 30 mL via ORAL
  Filled 2020-12-30: qty 30

## 2020-12-30 MED ORDER — KETOROLAC TROMETHAMINE 30 MG/ML IJ SOLN
30.0000 mg | Freq: Once | INTRAMUSCULAR | Status: AC
  Administered 2020-12-30: 30 mg via INTRAVENOUS
  Filled 2020-12-30: qty 1

## 2020-12-30 MED ORDER — IBUPROFEN 600 MG PO TABS: 600.0000 mg | ORAL_TABLET | Freq: Three times a day (TID) | ORAL | 0 refills | Status: DC | PRN

## 2020-12-30 MED ORDER — ACETAMINOPHEN 325 MG PO TABS: 650.0000 mg | ORAL_TABLET | Freq: Four times a day (QID) | ORAL | 0 refills | Status: DC | PRN

## 2020-12-30 MED ORDER — LIDOCAINE VISCOUS HCL 2 % MT SOLN
15.0000 mL | Freq: Once | OROMUCOSAL | Status: AC
  Administered 2020-12-30: 15 mL via ORAL
  Filled 2020-12-30: qty 15

## 2020-12-30 NOTE — ED Provider Notes (Signed)
[facility] EMERGENCY DEPT Provider Note   CSN: [id number] Arrival date & time: 12/30/20  1049     History Chief Complaint  Patient presents with   Cough   Sore Throat    Kylie Brown is a 75 y.o. female with history of diabetes present emerged department sore throat and cough.  Patient reports that she developed a cough and left-sided chest pain approximately 5 days ago.  Yesterday when she woke up she also had a very bad sore throat.  She reports she has had intermittent headaches.  She reports the food "just does not taste right".  She denies nausea, vomiting, diarrhea, myalgias.  She reports that she went to a family reunion approximately 1 week prior to the onset of her symptoms.  She reports that she has had 4 doses of the COVID-vaccine, the most recent was at the end of April 2022.  She has not been around any sick contacts she is aware of.  She has been taking ibuprofen for her sore throat with minimal relief.  She denies history of cardiac disease, MI, stents, CHF.  Denies hx of PE, COPD, Asthma.  HPI     Past Medical History:  Diagnosis Date   Arthritis    Bradycardia    Diabetes ([place])    Hypertension    Sleep apnea     There are no problems to display for this patient.   Past Surgical History:  Procedure Laterality Date   BACK SURGERY  1975   REPLACEMENT TOTAL KNEE BILATERAL     TOTAL ABDOMINAL HYSTERECTOMY       OB History     Gravida  2   Para      Term      Preterm      AB  1   Living  1      SAB  1   IAB      Ectopic      Multiple      Live Births  1           Family History  Problem Relation Age of Onset   Heart failure Mother    Hypertension Mother    Diabetes Mother    Bone cancer Father    Breast cancer Sister        lost left Breast    Lung cancer Brother     Social History   Tobacco Use   Smoking status: Never   Smokeless tobacco: Never  Vaping Use   Vaping Use: Never used   Substance Use Topics   Alcohol use: Not Currently   Drug use: Never    Home Medications Prior to Admission medications   Medication Sig Start Date End Date Taking? Authorizing Provider  acetaminophen (TYLENOL) 325 MG tablet Take 2 tablets (650 mg total) by mouth every 6 (six) hours as needed for up to 30 doses. 12/30/20  Yes Wyvonnia Dusky, MD  ibuprofen (ADVIL) 600 MG tablet Take 1 tablet (600 mg total) by mouth every 8 (eight) hours as needed for up to 30 doses for mild pain or moderate pain. Take with food 12/30/20  Yes Shaylin Blatt, Carola Rhine, MD  amLODipine (NORVASC) 5 MG tablet Take 5 mg by mouth daily.    [provider]  aspirin 81 MG EC tablet Take 81 mg by mouth daily. Swallow whole.    [provider]  cetirizine (ZYRTEC) 5 MG tablet Take 5 mg by mouth daily.    [provider]  cholecalciferol (VITAMIN D3) 25 MCG (1000 UNIT) tablet Take 1,000 Units by mouth daily.    [provider]  COVID-19 mRNA Vac-TriS, Pfizer, (PFIZER-BIONT COVID-19 VAC-TRIS) SUSP injection Inject into the muscle. 10/23/20   Carlyle Basques, MD  esomeprazole (NEXIUM) 40 MG capsule Take 40 mg by mouth daily at 12 noon.    [provider]  fosinopril (MONOPRIL) 20 MG tablet Take 20 mg by mouth daily.    [provider]  hydrochlorothiazide (HYDRODIURIL) 12.5 MG tablet Take 12.5 mg by mouth daily.    [provider]  meclizine (ANTIVERT) 25 MG tablet Take 1 tablet (25 mg total) by mouth 3 (three) times daily as needed for dizziness. 12/04/18   Henderly, Britni A, PA-C  Omega-3 Fatty Acids (FISH OIL) 1000 MG CAPS Take by mouth.    [provider]  rosuvastatin (CRESTOR) 5 MG tablet Take 5 mg by mouth daily.    [provider]    Allergies    Patient has no known allergies.  Review of Systems   Review of Systems  Constitutional:  Negative for chills and fever.  HENT:  Positive for sore throat. Negative for ear pain.   Eyes:  Negative  for pain and visual disturbance.  Respiratory:  Positive for cough and shortness of breath.   Cardiovascular:  Positive for chest pain. Negative for palpitations.  Gastrointestinal:  Negative for abdominal pain, nausea and vomiting.  Genitourinary:  Negative for dysuria and hematuria.  Musculoskeletal:  Negative for arthralgias and myalgias.  Skin:  Negative for color change and rash.  Neurological:  Positive for headaches. Negative for syncope.  All other systems reviewed and are negative.  Physical Exam Updated Vital Signs BP (!) 138/53 (BP Location: Left Arm)   Pulse (!) 46   Temp 98.1 F (36.7 C)   Resp 16   Ht 5\' 6"  (1.676 m)   Wt 114.3 kg   SpO2 99%   BMI 40.67 kg/m   Physical Exam Constitutional:      General: She is not in acute distress. HENT:     Head: Normocephalic and atraumatic.     Comments: Cervical lympnadenopathy, tender    Mouth/Throat:     Mouth: Mucous membranes are moist.     Tonsils: No tonsillar exudate or tonsillar abscesses.  Eyes:     Conjunctiva/sclera: Conjunctivae normal.     Pupils: Pupils are equal, round, and reactive to light.  Cardiovascular:     Rate and Rhythm: Normal rate and regular rhythm.  Pulmonary:     Effort: Pulmonary effort is normal. No respiratory distress.  Abdominal:     General: There is no distension.     Tenderness: There is no abdominal tenderness.  Skin:    General: Skin is warm and dry.  Neurological:     General: No focal deficit present.     Mental Status: She is alert. Mental status is at baseline.  Psychiatric:        Mood and Affect: Mood normal.        Behavior: Behavior normal.    ED Results / Procedures / Treatments   Labs (all labs ordered are listed, but only abnormal results are displayed) Labs Reviewed  CBC WITH DIFFERENTIAL/PLATELET - Abnormal; Notable for the following components:      Result Value   RBC 3.79 (*)    Hemoglobin 11.5 (*)    HCT 35.2 (*)    All other components within normal  limits  GROUP  A STREP BY PCR  RESP PANEL BY RT-PCR (FLU A&B, COVID) [id number]  BASIC METABOLIC PANEL  TROPONIN I (HIGH SENSITIVITY)  TROPONIN I (HIGH SENSITIVITY)    EKG EKG Interpretation  Date/Time:  Wednesday December 30 2020 10:54:55 EDT Ventricular Rate:  59 PR Interval:  122 QRS Duration: 96 QT Interval:  398 QTC Calculation: 394 R Axis:   41 Text Interpretation: Normal sinus rhythm No significant change from Jun 2020 ecg, no STEMI Confirmed by Octaviano Glow [phone]) on 12/30/2020 11:56:07 AM  Radiology DG Chest 2 View  Result Date: 12/30/2020 CLINICAL DATA:  Cough and pain. EXAM: CHEST - 2 VIEW COMPARISON:  None. FINDINGS: Linear opacity in the right mid lung consistent with scar. There is no edema, consolidation, effusion, or pneumothorax. Normal heart size and mediastinal contours. Spondylosis. IMPRESSION: No evidence of active disease. Electronically Signed   By: Monte Fantasia M.D.   On: 12/30/2020 12:03    Procedures Procedures   Medications Ordered in ED Medications  ketorolac (TORADOL) 30 MG/ML injection 30 mg (30 mg Intravenous Given 12/30/20 1244)  alum & mag hydroxide-simeth (MAALOX/MYLANTA) 200-200-20 MG/5ML suspension 30 mL (30 mLs Oral Given 12/30/20 1242)    And  lidocaine (XYLOCAINE) 2 % viscous mouth solution 15 mL (15 mLs Oral Given 12/30/20 1242)    ED Course  I have reviewed the triage vital signs and the nursing notes.  Pertinent labs & imaging results that were available during my care of the patient were reviewed by me and considered in my medical decision making (see chart for details).  Ddx includes viral syndrome vs PNA vs ACS vs other  EKG reviewed showing normal sinus rhythm with no significant changes from her prior tracing, she does have some borderline bradycardia but this appears to be baseline.    Chest x-ray ordered and reviewed showing no focal consolidations, no evidence of pneumothorax.   Clinical Course as of 12/30/20 1618  Wed Dec 30, 2020  1410 Influenza A By PCR: NEGATIVE Covid and flu negative. [MT]  1429 Patient feels much better after her medications, awaiting troponin and anticipate discharge with viral infection. [MT]    Clinical Course User Index [MT] Paulino Cork, Carola Rhine, MD    Final Clinical Impression(s) / ED Diagnoses Final diagnoses:  Viral URI    Rx / DC Orders ED Discharge Orders          Ordered    ibuprofen (ADVIL) 600 MG tablet  Every 8 hours PRN        12/30/20 1448    acetaminophen (TYLENOL) 325 MG tablet  Every 6 hours PRN        12/30/20 1448             Wyvonnia Dusky, MD 12/30/20 [phone]

## 2020-12-30 NOTE — ED Triage Notes (Signed)
Sore throat, cough, pain in her chest with inspiration, and diaphoresis since Friday.

## 2021-06-15 ENCOUNTER — Other Ambulatory Visit: Payer: Self-pay | Admitting: Family Medicine

## 2021-06-15 DIAGNOSIS — M278 Other specified diseases of jaws: Secondary | ICD-10-CM

## 2021-07-01 ENCOUNTER — Emergency Department (HOSPITAL_BASED_OUTPATIENT_CLINIC_OR_DEPARTMENT_OTHER)
Admission: EM | Admit: 2021-07-01 | Discharge: 2021-07-02 | Disposition: A | Discharge status: Home / Self Care | Payer: Medicare HMO | Attending: Emergency Medicine | Admitting: Emergency Medicine

## 2021-07-01 ENCOUNTER — Encounter (HOSPITAL_BASED_OUTPATIENT_CLINIC_OR_DEPARTMENT_OTHER): Payer: Self-pay | Admitting: Obstetrics and Gynecology

## 2021-07-01 ENCOUNTER — Other Ambulatory Visit: Payer: Self-pay

## 2021-07-01 ENCOUNTER — Emergency Department (HOSPITAL_BASED_OUTPATIENT_CLINIC_OR_DEPARTMENT_OTHER): Payer: Medicare HMO | Admitting: Radiology

## 2021-07-01 DIAGNOSIS — R519 Headache, unspecified: Secondary | ICD-10-CM | POA: Diagnosis not present

## 2021-07-01 DIAGNOSIS — Z7982 Long term (current) use of aspirin: Secondary | ICD-10-CM | POA: Insufficient documentation

## 2021-07-01 DIAGNOSIS — Z79899 Other long term (current) drug therapy: Secondary | ICD-10-CM | POA: Diagnosis not present

## 2021-07-01 DIAGNOSIS — R001 Bradycardia, unspecified: Secondary | ICD-10-CM | POA: Diagnosis not present

## 2021-07-01 DIAGNOSIS — Z20822 Contact with and (suspected) exposure to covid-19: Secondary | ICD-10-CM | POA: Insufficient documentation

## 2021-07-01 LAB — CBC
HCT: 36.3 % (ref 36.0–46.0)
Hemoglobin: 12.1 g/dL (ref 12.0–15.0)
MCH: 30.7 pg (ref 26.0–34.0)
MCHC: 33.3 g/dL (ref 30.0–36.0)
MCV: 92.1 fL (ref 80.0–100.0)
Platelets: 272 10*3/uL (ref 150–400)
RBC: 3.94 MIL/uL (ref 3.87–5.11)
RDW: 12.3 % (ref 11.5–15.5)
WBC: 6.2 10*3/uL (ref 4.0–10.5)
nRBC: 0 % (ref 0.0–0.2)

## 2021-07-01 LAB — BASIC METABOLIC PANEL
Anion gap: 11 (ref 5–15)
BUN: 8 mg/dL (ref 8–23)
CO2: 24 mmol/L (ref 22–32)
Calcium: 9.6 mg/dL (ref 8.9–10.3)
Chloride: 106 mmol/L (ref 98–111)
Creatinine, Ser: 0.7 mg/dL (ref 0.44–1.00)
GFR, Estimated: >60 mL/min (ref >60)
Glucose, Bld: 90 mg/dL (ref 70–99)
Potassium: 3.7 mmol/L (ref 3.5–5.1)
Sodium: 141 mmol/L (ref 135–145)

## 2021-07-01 LAB — TROPONIN I (HIGH SENSITIVITY): Troponin I (High Sensitivity): 5 ng/L (ref <18)

## 2021-07-01 NOTE — ED Triage Notes (Signed)
Patient reports to the ER for HR in the 30's. Patient reports her doctor from [place] told her to come here to be seen.

## 2021-07-02 ENCOUNTER — Emergency Department (HOSPITAL_BASED_OUTPATIENT_CLINIC_OR_DEPARTMENT_OTHER): Payer: Medicare HMO

## 2021-07-02 DIAGNOSIS — R001 Bradycardia, unspecified: Secondary | ICD-10-CM | POA: Diagnosis not present

## 2021-07-02 LAB — RESP PANEL BY RT-PCR (FLU A&B, COVID) ARPGX2
Influenza A by PCR: NEGATIVE
Influenza B by PCR: NEGATIVE
SARS Coronavirus 2 by RT PCR: NEGATIVE

## 2021-07-02 MED ORDER — ACETAMINOPHEN 325 MG PO TABS
650.0000 mg | ORAL_TABLET | Freq: Once | ORAL | Status: AC
  Administered 2021-07-02: 650 mg via ORAL
  Filled 2021-07-02: qty 2

## 2021-07-02 NOTE — ED Notes (Signed)
Pt able to ambulate around the room with little assistance, no complaints while ambulating

## 2021-07-02 NOTE — ED Provider Notes (Signed)
[facility] EMERGENCY DEPT Provider Note   CSN: [id number] Arrival date & time: 07/01/21  1533     History  Chief Complaint  Patient presents with   Bradycardia    Kylie Brown is a 76 y.o. female.  The history is provided by the patient and medical records.  Kylie Brown is a 76 y.o. female who presents to the Emergency Department complaining of bradycardia.  She presents to the ED for evaluation of bradycardia.  She has a hx/o bradycardia for the last 5-6 years.  She had an episode of HR of 38 one month ago while sleeping - had no sxs when her daughter woke her.  Her daughter has been checking her HR twice daily and once when she sleeps for the last two months.  Today she presents for evaluation because she is also not feeling well.  She has fatigue for one month.  Reports HA x 1 week  - top of head, comes and goes, described as mild.  Has associated nausea, comes and goes - not related to HA, none currently.   Sometimes feels dizzy - comes and goes for one month.    Left chest does not feel right for the last month - comes and goes.   No fever.  Uses a CPAP.  Had trouble breathing one week ago - now resolved.  No cough, N/V/D.  Has constipation.  No dysuria.  No lower extremity edema.      Home Medications Prior to Admission medications   Medication Sig Start Date End Date Taking? Authorizing Provider  acetaminophen (TYLENOL) 325 MG tablet Take 2 tablets (650 mg total) by mouth every 6 (six) hours as needed for up to 30 doses. 12/30/20   Wyvonnia Dusky, MD  amLODipine (NORVASC) 5 MG tablet Take 5 mg by mouth daily.    [provider]  aspirin 81 MG EC tablet Take 81 mg by mouth daily. Swallow whole.    [provider]  cetirizine (ZYRTEC) 5 MG tablet Take 5 mg by mouth daily.    [provider]  cholecalciferol (VITAMIN D3) 25 MCG (1000 UNIT) tablet Take 1,000 Units by mouth daily.    [provider]  COVID-19 mRNA Vac-TriS, Pfizer, (PFIZER-BIONT COVID-19 VAC-TRIS) SUSP injection Inject into the muscle. 10/23/20   Carlyle Basques, MD  esomeprazole (NEXIUM) 40 MG capsule Take 40 mg by mouth daily at 12 noon.    [provider]  fosinopril (MONOPRIL) 20 MG tablet Take 20 mg by mouth daily.    [provider]  hydrochlorothiazide (HYDRODIURIL) 12.5 MG tablet Take 12.5 mg by mouth daily.    [provider]  ibuprofen (ADVIL) 600 MG tablet Take 1 tablet (600 mg total) by mouth every 8 (eight) hours as needed for up to 30 doses for mild pain or moderate pain. Take with food 12/30/20   Wyvonnia Dusky, MD  meclizine (ANTIVERT) 25 MG tablet Take 1 tablet (25 mg total) by mouth 3 (three) times daily as needed for dizziness. 12/04/18   Henderly, Britni A, PA-C  Omega-3 Fatty Acids (FISH OIL) 1000 MG CAPS Take by mouth.    [provider]  rosuvastatin (CRESTOR) 5 MG tablet Take 5 mg by mouth daily.    [provider]      Allergies    Patient has no known allergies.    Review of Systems   Review of Systems  All other systems reviewed and are negative.  Physical Exam Updated Vital Signs BP (!) 137/55 (BP Location: Left Arm)    Pulse (!) 50    Temp 99 F (37.2 C)    Resp 17    SpO2 99%  Physical Exam Vitals and nursing note reviewed.  Constitutional:      Appearance: She is well-developed.  HENT:     Head: Normocephalic and atraumatic.  Cardiovascular:     Rate and Rhythm: Regular rhythm. Bradycardia present.     Heart sounds: No murmur heard. Pulmonary:     Effort: Pulmonary effort is normal. No respiratory distress.     Breath sounds: Normal breath sounds.  Abdominal:     Palpations: Abdomen is soft.     Tenderness: There is no abdominal tenderness. There is no guarding or rebound.  Musculoskeletal:        General: No swelling or tenderness.  Skin:    General: Skin is warm and dry.  Neurological:     Mental Status: She is alert and  oriented to person, place, and time.     Comments: No asymmetry of facial movement.  5/5 strength in all four extremities.    Psychiatric:        Behavior: Behavior normal.    ED Results / Procedures / Treatments   Labs (all labs ordered are listed, but only abnormal results are displayed) Labs Reviewed  RESP PANEL BY RT-PCR (FLU A&B, COVID) ARPGX2  BASIC METABOLIC PANEL  CBC  TROPONIN I (HIGH SENSITIVITY)  TROPONIN I (HIGH SENSITIVITY)    EKG EKG Interpretation  Date/Time:  Thursday July 01 2021 15:47:24 EST Ventricular Rate:  56 PR Interval:  120 QRS Duration: 94 QT Interval:  428 QTC Calculation: 413 R Axis:   26 Text Interpretation: Sinus bradycardia Nonspecific ST abnormality Abnormal ECG When compared with ECG of 30-Dec-2020 10:54, T wave amplitude has increased in Anterior leads Confirmed by Quintella Reichert [phone]) on 07/02/2021 12:12:37 AM  Radiology DG Chest 2 View  Result Date: 07/01/2021 CLINICAL DATA:  Chest pain. EXAM: CHEST - 2 VIEW COMPARISON:  December 30, 2020. FINDINGS: The heart size and mediastinal contours are within normal limits. Stable right midlung scarring. No acute abnormality is noted. The visualized skeletal structures are unremarkable. IMPRESSION: No active cardiopulmonary disease. Electronically Signed   By: Marijo Conception M.D.   On: 07/01/2021 16:10   CT Head Wo Contrast  Result Date: 07/02/2021 CLINICAL DATA:  Bradycardia. EXAM: CT HEAD WITHOUT CONTRAST TECHNIQUE: Contiguous axial images were obtained from the base of the skull through the vertex without intravenous contrast. COMPARISON:  December 02, 2020 FINDINGS: Brain: There is mild cerebral atrophy with widening of the extra-axial spaces and ventricular dilatation. There are areas of decreased attenuation within the white matter tracts of the supratentorial brain, consistent with microvascular disease changes. Vascular: No hyperdense vessel or unexpected calcification. Skull: Normal. Negative for  fracture or focal lesion. Sinuses/Orbits: No acute finding. Other: None. IMPRESSION: 1. Generalized cerebral atrophy. 2. No acute intracranial abnormality. Electronically Signed   By: Virgina Norfolk M.D.   On: 07/02/2021 01:00    Procedures Procedures    Medications Ordered in ED Medications  acetaminophen (TYLENOL) tablet 650 mg (650 mg Oral Given 07/02/21 0033)    ED Course/ Medical Decision Making/ A&P                           Medical Decision Making  Patient with history of hypertension on ACE inhibitor and HCTZ  here for evaluation of bradycardia for the last several years, noticed for the last few months as family is checking her heart rate more frequently.  She also complains of mild headache.  On evaluation she is nontoxic-appearing.  She is bradycardic but well perfused with a sinus bradycardia.  She is not on any rate reducing medications.  Given complaint of headache a CT head was obtained to rule out space-occupying lesion.  CT is negative for acute abnormality.  She was treated with acetaminophen with improvement in her headache in the emergency department.  In terms of her bradycardia, feel this is not symptomatic at this time and she is stable for cardiology follow-up.  We will place a referral for cardiology.  In terms of her headache, this is currently resolved.  Presentation is not consistent with subarachnoid hemorrhage, meningitis, dural sinus thrombosis, temporal arteritis.  Discussed with patient home care, outpatient follow-up and return precautions.        Final Clinical Impression(s) / ED Diagnoses Final diagnoses:  Bradycardia    Rx / DC Orders ED Discharge Orders          Ordered    Ambulatory referral to Cardiology        07/02/21 0219              Quintella Reichert, MD 07/02/21 [phone]

## 2021-07-14 ENCOUNTER — Ambulatory Visit
Admission: RE | Admit: 2021-07-14 | Discharge: 2021-07-14 | Disposition: A | Discharge status: Home / Self Care | Payer: Medicare HMO | Source: Ambulatory Visit | Attending: Family Medicine | Admitting: Family Medicine

## 2021-07-14 ENCOUNTER — Other Ambulatory Visit: Payer: Self-pay

## 2021-07-14 DIAGNOSIS — M278 Other specified diseases of jaws: Secondary | ICD-10-CM

## 2021-07-14 MED ORDER — IOPAMIDOL (ISOVUE-300) INJECTION 61%
75.0000 mL | Freq: Once | INTRAVENOUS | Status: AC | PRN
  Administered 2021-07-14: 75 mL via INTRAVENOUS

## 2021-07-19 NOTE — Progress Notes (Signed)
Cardiology Office Note:    Date:  07/21/2021   ID:  Kylie Brown, DOB 28-Aug-1945, MRN [id number]  PCP:  Kathyrn Lass, MD   [facility] Providers Cardiologist:  Werner Lean, MD     Referring MD: Kathyrn Lass, MD   CC: SOB Consulted for the evaluation of Dizziness and bradycardia at the behest of Kathyrn Lass, MD  History of Present Illness:    Kylie Brown is a 76 y.o. female with a hx of HTN and DM, Morbid Obesity, OSA , prior history of DVT.  who present for evaluation 07/21/21.  Patient notes that she is feeling new chest pain.  Over the past year had had chest pain.  Sometimes it feels like palpitations with fast heart rates when she isn't doing anything.  Notes that she also has squeezing and stabbing pain.  This occurs at rest. Patient exertion notable for minimally activity, had some SOB just getting here.  Index for ED evaluation was patient making noise while asleep while on CPAP.  Heart rate was as low as 39.  Notes resting shortness of breath DOE .  Unable to lay flat because of SOB.  Notes weight gain, without leg swelling or abdominal swelling.  No syncope or near syncope .   Patient reports prior cardiac testing including a stress test (chemical because she could not do exercise testing)  Past Medical History:  Diagnosis Date   Arthritis    Bradycardia    Diabetes ([place])    Hypertension    Sleep apnea     Past Surgical History:  Procedure Laterality Date   BACK SURGERY  1975   REPLACEMENT TOTAL KNEE BILATERAL     TOTAL ABDOMINAL HYSTERECTOMY      Current Medications: Current Meds  Medication Sig   Acetaminophen 500 MG capsule Take by mouth as needed.   amLODipine (NORVASC) 5 MG tablet Take 5 mg by mouth daily.   aspirin 81 MG EC tablet Take 81 mg by mouth daily. Swallow whole.   cetirizine (ZYRTEC) 5 MG tablet Take 5 mg by mouth daily.   cholecalciferol (VITAMIN D3) 25 MCG (1000 UNIT) tablet Take 1,000 Units  by mouth daily.   esomeprazole (NEXIUM) 40 MG capsule Take 40 mg by mouth daily at 12 noon.   fluticasone (FLONASE) 50 MCG/ACT nasal spray as needed.   fosinopril (MONOPRIL) 20 MG tablet Take 20 mg by mouth daily.   hydrochlorothiazide (HYDRODIURIL) 12.5 MG tablet Take 12.5 mg by mouth daily.   ibuprofen (ADVIL) 600 MG tablet Take 1 tablet (600 mg total) by mouth every 8 (eight) hours as needed for up to 30 doses for mild pain or moderate pain. Take with food   meclizine (ANTIVERT) 25 MG tablet Take 1 tablet (25 mg total) by mouth 3 (three) times daily as needed for dizziness.   meloxicam (MOBIC) 15 MG tablet Take 15 mg by mouth as needed.   metFORMIN (GLUCOPHAGE-XR) 500 MG 24 hr tablet Take 500 mg by mouth 2 (two) times daily.   rosuvastatin (CRESTOR) 5 MG tablet Take 5 mg by mouth daily.   [DISCONTINUED] acetaminophen (TYLENOL) 325 MG tablet Take 2 tablets (650 mg total) by mouth every 6 (six) hours as needed for up to 30 doses.   [DISCONTINUED] COVID-19 mRNA Vac-TriS, Pfizer, (PFIZER-BIONT COVID-19 VAC-TRIS) SUSP injection Inject into the muscle.   [DISCONTINUED] Omega-3 Fatty Acids (FISH OIL) 1000 MG CAPS Take by mouth.     Allergies:   Dexlansoprazole  Social History   Socioeconomic History   Marital status: Single    Spouse name: Not on file   Number of children: Not on file   Years of education: Not on file   Highest education level: Not on file  Occupational History   Not on file  Tobacco Use   Smoking status: Never   Smokeless tobacco: Never  Vaping Use   Vaping Use: Never used  Substance and Sexual Activity   Alcohol use: Not Currently   Drug use: Never   Sexual activity: Not Currently    Birth control/protection: Post-menopausal  Other Topics Concern   Not on file  Social History Narrative   Not on file   Social Determinants of Health   Financial Resource Strain: Not on file  Food Insecurity: Not on file  Transportation Needs: Not on file  Physical Activity:  Not on file  Stress: Not on file  Social Connections: Not on file    Social: Husband passed from CHF, sister comes to visit  Family History: The patient's family history includes Bone cancer in her father; Breast cancer in her sister; Diabetes in her mother; Heart failure in her mother; Hypertension in her mother; Lung cancer in her brother.  ROS:   Please see the history of present illness.     All other systems reviewed and are negative.  EKGs/Labs/Other Studies Reviewed:    The following studies were reviewed today:  EKG:   07/02/21:  Sinus bradycardia rate 56 no PR prolongation or heart block.  Recent Labs: 07/01/2021: BUN 8; Creatinine, Ser 0.70; Hemoglobin 12.1; Platelets 272; Potassium 3.7; Sodium 141  Recent Lipid Panel No results found for: CHOL, TRIG, HDL, CHOLHDL, VLDL, LDLCALC, LDLDIRECT       Physical Exam:    VS:  BP 138/60    Pulse (!) 57    Ht 5\' 6"  (1.676 m)    Wt 109.3 kg    SpO2 99%    BMI 38.90 kg/m     Wt Readings from Last 3 Encounters:  07/21/21 109.3 kg  12/30/20 114.3 kg  11/11/20 112.9 kg     Gen: no distress, morbid obesity   Neck: No JVD Cardiac: No Rubs or Gallops, no Murmur, regular bradycardia, +2 radial pulses Respiratory: Clear to auscultation bilaterally, normal effort, normal  respiratory rate GI: Soft, nontender, non-distended  MS: minimal R leg pitting  edema;  moves all extremities Integument: Skin feels warm Neuro:  At time of evaluation, alert and oriented to person/place/time/situation  Psych: Normal affect, patient feels well   ASSESSMENT:    1. Precordial pain   2. SOB (shortness of breath)   3. Hypertension associated with diabetes ([place])   4. Bradycardia    PLAN:    Precordial CP HTN with DM Morbid Obesity Fatigue with bradycardia SOB - Coronary CT with no metoprolol for CP given risk factors - Echo for SOB - unable to do POET for bradycardia, question of chronotropic incompetence, will do non live ziopatch in  stead  F/u with me in 3 month          Medication Adjustments/Labs and Tests Ordered: Current medicines are reviewed at length with the patient today.  Concerns regarding medicines are outlined above.  Orders Placed This Encounter  Procedures   CT CORONARY MORPH W/CTA COR W/SCORE W/CA W/CM &/OR WO/CM   Basic metabolic panel   ECHOCARDIOGRAM COMPLETE   No orders of the defined types were placed in this encounter.   Patient Instructions  Medication Instructions:  Your physician recommends that you continue on your current medications as directed. Please refer to the Current Medication list given to you today.  *If you need a refill on your cardiac medications before your next appointment, please call your pharmacy*   Lab Work: TODAY: BMET  If you have labs (blood work) drawn today and your tests are completely normal, you will receive your results only by: [place] (if you have MyChart) OR A paper copy in the mail If you have any lab test that is abnormal or we need to change your treatment, we will call you to review the results.   Testing/Procedures: Your physician has requested that you have an echocardiogram. Echocardiography is a painless test that uses sound waves to create images of your heart. It provides your doctor with information about the size and shape of your heart and how well your hearts chambers and valves are working. This procedure takes approximately one hour. There are no restrictions for this procedure.  Your physician has requested that you have cardiac CT. Cardiac computed tomography (CT) is a painless test that uses an x-ray machine to take clear, detailed pictures of your heart. For further information please visit [email or web address]. Please follow instruction sheet as given.    Your physician has requested that you wear a 14 day heart monitor.    Follow-Up: At [facility], you and your health needs are our priority.  As part of our  continuing mission to provide you with exceptional heart care, we have created designated Provider Care Teams.  These Care Teams include your primary Cardiologist (physician) and Advanced Practice Providers (APPs -  Physician Assistants and Nurse Practitioners) who all work together to provide you with the care you need, when you need it.   Your next appointment:   3 -4 month(s)  The format for your next appointment:   In Person  Provider:   Werner Lean, MD      Other Instructions    Your cardiac CT will be scheduled at one of the below locations:   [facility] [address] [place], [place] [address] [phone]   At [facility], please arrive at the [facility] main entrance (entrance A) of [facility] 30 minutes prior to test start time. You can use the FREE valet parking offered at the main entrance (encouraged to control the heart rate for the test) Proceed to the [facility] Radiology Department (first floor) to check-in and test prep.   Please follow these instructions carefully (unless otherwise directed):   On the Night Before the Test: Be sure to Drink plenty of water. Do not consume any caffeinated/decaffeinated beverages or chocolate 12 hours prior to your test. Do not take any antihistamines (Zyrtec/ Antivert) 12 hours prior to your test.   On the Day of the Test: Drink plenty of water until 1 hour prior to the test. Do not eat any food 4 hours prior to the test. You may take your regular medications prior to the test.  HOLD Hydrochlorothiazide morning of the test. FEMALES- please wear underwire-free bra if available, avoid dresses & tight clothing        After the Test: Drink plenty of water. After receiving IV contrast, you may experience a mild flushed feeling. This is normal. On occasion, you may experience a mild rash up to 24 hours after the test. This is not dangerous. If this occurs, you can take  Benadryl 25 mg  and increase your fluid intake. If you experience trouble breathing, this can be serious. If it is severe call 911 IMMEDIATELY. If it is mild, please call our office. If you take any of these medications: Metformin: please do not take 48 hours after completing test unless otherwise instructed.  We will call to schedule your test 2-4 weeks out understanding that some insurance companies will need an authorization prior to the service being performed.   For non-scheduling related questions, please contact the cardiac imaging nurse navigator should you have any questions/concerns: Marchia Bond, Cardiac Imaging Nurse Navigator Gordy Clement, Cardiac Imaging Nurse Navigator [facility] Direct Office Dial: [phone]   For scheduling needs, including cancellations and rescheduling, please call [place], [phone].      ZIO XT- Long Term Monitor Instructions  Your physician has requested you wear a ZIO patch monitor for 14 days.  This is a single patch monitor. [employer] supplies one patch monitor per enrollment. Additional stickers are not available. Please do not apply patch if you will be having a Nuclear Stress Test,  Echocardiogram, Cardiac CT, MRI, or Chest Xray during the period you would be wearing the  monitor. The patch cannot be worn during these tests. You cannot remove and re-apply the  ZIO XT patch monitor.  Your ZIO patch monitor will be mailed 3 day USPS to your address on file. It may take 3-5 days  to receive your monitor after you have been enrolled.  Once you have received your monitor, please review the enclosed instructions. Your monitor  has already been registered assigning a specific monitor serial # to you.  Billing and Patient Assistance Program Information  We have supplied [employer] with any of your insurance information on file for billing purposes. [employer] offers a sliding scale Patient Assistance Program for  patients that do not have  insurance, or whose insurance does not completely cover the cost of the ZIO monitor.  You must apply for the Patient Assistance Program to qualify for this discounted rate.  To apply, please call [employer] at [phone], select option 4, select option 2, ask to apply for  Patient Assistance Program. Theodore Demark will ask your household income, and how many people  are in your household. They will quote your out-of-pocket cost based on that information.  [employer] will also be able to set up a 40-month, interest-free payment plan if needed.  Applying the monitor   Shave hair from upper left chest.  Hold abrader disc by orange tab. Rub abrader in 40 strokes over the upper left chest as  indicated in your monitor instructions.  Clean area with 4 enclosed alcohol pads. Let dry.  Apply patch as indicated in monitor instructions. Patch will be placed under collarbone on left  side of chest with arrow pointing upward.  Rub patch adhesive wings for 2 minutes. Remove white label marked "1". Remove the white  label marked "2". Rub patch adhesive wings for 2 additional minutes.  While looking in a mirror, press and release button in center of patch. A small green light will  flash 3-4 times. This will be your only indicator that the monitor has been turned on.  Do not shower for the first 24 hours. You may shower after the first 24 hours.  Press the button if you feel a symptom. You will hear a small click. Record Date, Time and  Symptom in the Patient Logbook.  When you are ready to remove the patch, follow instructions on  the last 2 pages of Patient  Logbook. Stick patch monitor onto the last page of Patient Logbook.  Place Patient Logbook in the blue and white box. Use locking tab on box and tape box closed  securely. The blue and white box has prepaid postage on it. Please place it in the mailbox as  soon as possible. Your physician should have your test results approximately  7 days after the  monitor has been mailed back to [facility].  Call [place] at [phone] if you have questions regarding  your ZIO XT patch monitor. Call them immediately if you see an orange light blinking on your  monitor.  If your monitor falls off in less than 4 days, contact our Monitor department at [phone].  If your monitor becomes loose or falls off after 4 days call [employer] at [phone] for  suggestions on securing your monitor     Signed, Werner Lean, MD  07/21/2021 3:36 PM    [place]

## 2021-07-21 ENCOUNTER — Ambulatory Visit (INDEPENDENT_AMBULATORY_CARE_PROVIDER_SITE_OTHER): Payer: Medicare HMO

## 2021-07-21 ENCOUNTER — Other Ambulatory Visit: Payer: Self-pay

## 2021-07-21 ENCOUNTER — Encounter: Payer: Self-pay | Admitting: Internal Medicine

## 2021-07-21 ENCOUNTER — Other Ambulatory Visit: Payer: Self-pay | Admitting: Internal Medicine

## 2021-07-21 ENCOUNTER — Ambulatory Visit (INDEPENDENT_AMBULATORY_CARE_PROVIDER_SITE_OTHER): Payer: Medicare HMO | Admitting: Internal Medicine

## 2021-07-21 VITALS — BP 138/60 | HR 57 | Ht 66.0 in | Wt 241.0 lb

## 2021-07-21 DIAGNOSIS — R0602 Shortness of breath: Secondary | ICD-10-CM

## 2021-07-21 DIAGNOSIS — R42 Dizziness and giddiness: Secondary | ICD-10-CM

## 2021-07-21 DIAGNOSIS — R072 Precordial pain: Secondary | ICD-10-CM

## 2021-07-21 DIAGNOSIS — E1159 Type 2 diabetes mellitus with other circulatory complications: Secondary | ICD-10-CM

## 2021-07-21 DIAGNOSIS — R002 Palpitations: Secondary | ICD-10-CM

## 2021-07-21 DIAGNOSIS — R001 Bradycardia, unspecified: Secondary | ICD-10-CM | POA: Insufficient documentation

## 2021-07-21 DIAGNOSIS — I152 Hypertension secondary to endocrine disorders: Secondary | ICD-10-CM

## 2021-07-21 NOTE — Progress Notes (Unsigned)
Enrolled for [employer] to mail a ZIO XT long term holter monitor to the patients address on file.  

## 2021-07-21 NOTE — Patient Instructions (Signed)
Medication Instructions:  Your physician recommends that you continue on your current medications as directed. Please refer to the Current Medication list given to you today.  *If you need a refill on your cardiac medications before your next appointment, please call your pharmacy*   Lab Work: TODAY: BMET  If you have labs (blood work) drawn today and your tests are completely normal, you will receive your results only by: [place] (if you have MyChart) OR A paper copy in the mail If you have any lab test that is abnormal or we need to change your treatment, we will call you to review the results.   Testing/Procedures: Your physician has requested that you have an echocardiogram. Echocardiography is a painless test that uses sound waves to create images of your heart. It provides your doctor with information about the size and shape of your heart and how well your hearts chambers and valves are working. This procedure takes approximately one hour. There are no restrictions for this procedure.  Your physician has requested that you have cardiac CT. Cardiac computed tomography (CT) is a painless test that uses an x-ray machine to take clear, detailed pictures of your heart. For further information please visit [email or web address].[email or web address]. Please follow instruction sheet as given.    Your physician has requested that you wear a 14 day heart monitor.    Follow-Up: At [facility], you and your health needs are our priority.  As part of our continuing mission to provide you with exceptional heart care, we have created designated Provider Care Teams.  These Care Teams include your primary Cardiologist (physician) and Advanced Practice Providers (APPs -  Physician Assistants and Nurse Practitioners) who all work together to provide you with the care you need, when you need it.   Your next appointment:   3 -4 month(s)  The format for your next appointment:   In Person  Provider:   Werner Lean, MD      Other Instructions    Your cardiac CT will be scheduled at one of the below locations:   [facility] [address] [place], [place] [address] [phone]   At [facility], please arrive at the [facility] main entrance (entrance A) of [facility] 30 minutes prior to test start time. You can use the FREE valet parking offered at the main entrance (encouraged to control the heart rate for the test) Proceed to the [facility] Radiology Department (first floor) to check-in and test prep.   Please follow these instructions carefully (unless otherwise directed):   On the Night Before the Test: Be sure to Drink plenty of water. Do not consume any caffeinated/decaffeinated beverages or chocolate 12 hours prior to your test. Do not take any antihistamines (Zyrtec/ Antivert) 12 hours prior to your test.   On the Day of the Test: Drink plenty of water until 1 hour prior to the test. Do not eat any food 4 hours prior to the test. You may take your regular medications prior to the test.  HOLD Hydrochlorothiazide morning of the test. FEMALES- please wear underwire-free bra if available, avoid dresses & tight clothing        After the Test: Drink plenty of water. After receiving IV contrast, you may experience a mild flushed feeling. This is normal. On occasion, you may experience a mild rash up to 24 hours after the test. This is not dangerous. If this occurs, you can take Benadryl 25 mg and  increase your fluid intake. If you experience trouble breathing, this can be serious. If it is severe call 911 IMMEDIATELY. If it is mild, please call our office. If you take any of these medications: Metformin: please do not take 48 hours after completing test unless otherwise instructed.  We will call to schedule your test 2-4 weeks out understanding that some insurance companies will need an authorization prior to the service being  performed.   For non-scheduling related questions, please contact the cardiac imaging nurse navigator should you have any questions/concerns: Marchia Bond, Cardiac Imaging Nurse Navigator Gordy Clement, Cardiac Imaging Nurse Navigator [facility] Direct Office Dial: [phone]   For scheduling needs, including cancellations and rescheduling, please call [place], [phone].      ZIO XT- Long Term Monitor Instructions  Your physician has requested you wear a ZIO patch monitor for 14 days.  This is a single patch monitor. [employer] supplies one patch monitor per enrollment. Additional stickers are not available. Please do not apply patch if you will be having a Nuclear Stress Test,  Echocardiogram, Cardiac CT, MRI, or Chest Xray during the period you would be wearing the  monitor. The patch cannot be worn during these tests. You cannot remove and re-apply the  ZIO XT patch monitor.  Your ZIO patch monitor will be mailed 3 day USPS to your address on file. It may take 3-5 days  to receive your monitor after you have been enrolled.  Once you have received your monitor, please review the enclosed instructions. Your monitor  has already been registered assigning a specific monitor serial # to you.  Billing and Patient Assistance Program Information  We have supplied [employer] with any of your insurance information on file for billing purposes. [employer] offers a sliding scale Patient Assistance Program for patients that do not have  insurance, or whose insurance does not completely cover the cost of the ZIO monitor.  You must apply for the Patient Assistance Program to qualify for this discounted rate.  To apply, please call [employer] at [phone], select option 4, select option 2, ask to apply for  Patient Assistance Program. Theodore Demark will ask your household income, and how many people  are in your household. They will quote your out-of-pocket cost based on  that information.  [employer] will also be able to set up a 47-month, interest-free payment plan if needed.  Applying the monitor   Shave hair from upper left chest.  Hold abrader disc by orange tab. Rub abrader in 40 strokes over the upper left chest as  indicated in your monitor instructions.  Clean area with 4 enclosed alcohol pads. Let dry.  Apply patch as indicated in monitor instructions. Patch will be placed under collarbone on left  side of chest with arrow pointing upward.  Rub patch adhesive wings for 2 minutes. Remove white label marked "1". Remove the white  label marked "2". Rub patch adhesive wings for 2 additional minutes.  While looking in a mirror, press and release button in center of patch. A small green light will  flash 3-4 times. This will be your only indicator that the monitor has been turned on.  Do not shower for the first 24 hours. You may shower after the first 24 hours.  Press the button if you feel a symptom. You will hear a small click. Record Date, Time and  Symptom in the Patient Logbook.  When you are ready to remove the patch, follow instructions on  the last 2 pages of Patient  Logbook. Stick patch monitor onto the last page of Patient Logbook.  Place Patient Logbook in the blue and white box. Use locking tab on box and tape box closed  securely. The blue and white box has prepaid postage on it. Please place it in the mailbox as  soon as possible. Your physician should have your test results approximately 7 days after the  monitor has been mailed back to [facility].  Call [place] at [phone] if you have questions regarding  your ZIO XT patch monitor. Call them immediately if you see an orange light blinking on your  monitor.  If your monitor falls off in less than 4 days, contact our Monitor department at [phone].  If your monitor becomes loose or falls off after 4 days call [employer] at [phone] for  suggestions on  securing your monitor

## 2021-07-22 LAB — BASIC METABOLIC PANEL
BUN/Creatinine Ratio: 12 (ref 12–28)
BUN: 9 mg/dL (ref 8–27)
CO2: 23 mmol/L (ref 20–29)
Calcium: 9.8 mg/dL (ref 8.7–10.3)
Chloride: 105 mmol/L (ref 96–106)
Creatinine, Ser: 0.77 mg/dL (ref 0.57–1.00)
Glucose: 93 mg/dL (ref 70–99)
Potassium: 4.5 mmol/L (ref 3.5–5.2)
Sodium: 144 mmol/L (ref 134–144)
eGFR: 81 mL/min/{1.73_m2} (ref >59)

## 2021-07-23 DIAGNOSIS — R0602 Shortness of breath: Secondary | ICD-10-CM

## 2021-07-23 DIAGNOSIS — R001 Bradycardia, unspecified: Secondary | ICD-10-CM | POA: Diagnosis not present

## 2021-07-23 DIAGNOSIS — R42 Dizziness and giddiness: Secondary | ICD-10-CM

## 2021-07-23 DIAGNOSIS — R002 Palpitations: Secondary | ICD-10-CM | POA: Diagnosis not present

## 2021-07-23 DIAGNOSIS — R072 Precordial pain: Secondary | ICD-10-CM | POA: Diagnosis not present

## 2021-08-17 ENCOUNTER — Ambulatory Visit (HOSPITAL_COMMUNITY): Payer: Medicare HMO | Attending: Cardiovascular Disease

## 2021-08-17 ENCOUNTER — Other Ambulatory Visit: Payer: Self-pay

## 2021-08-17 DIAGNOSIS — R0602 Shortness of breath: Secondary | ICD-10-CM | POA: Diagnosis present

## 2021-08-17 DIAGNOSIS — R072 Precordial pain: Secondary | ICD-10-CM | POA: Insufficient documentation

## 2021-08-17 LAB — ECHOCARDIOGRAM COMPLETE
Area-P 1/2: 3.95 cm2
S' Lateral: 3.3 cm

## 2021-08-20 ENCOUNTER — Other Ambulatory Visit: Payer: Self-pay

## 2021-08-20 DIAGNOSIS — E1159 Type 2 diabetes mellitus with other circulatory complications: Secondary | ICD-10-CM

## 2021-08-20 DIAGNOSIS — I152 Hypertension secondary to endocrine disorders: Secondary | ICD-10-CM

## 2021-08-20 NOTE — Progress Notes (Signed)
Order placed for BMP per Cardiac CT on 08/31/21.  Lab appointment scheduled for 08/23/21.

## 2021-08-23 ENCOUNTER — Other Ambulatory Visit: Payer: Self-pay

## 2021-08-23 ENCOUNTER — Other Ambulatory Visit: Payer: Medicare HMO | Admitting: *Deleted

## 2021-08-23 DIAGNOSIS — I152 Hypertension secondary to endocrine disorders: Secondary | ICD-10-CM

## 2021-08-23 DIAGNOSIS — E1159 Type 2 diabetes mellitus with other circulatory complications: Secondary | ICD-10-CM

## 2021-08-23 LAB — BASIC METABOLIC PANEL
BUN/Creatinine Ratio: 15 (ref 12–28)
BUN: 11 mg/dL (ref 8–27)
CO2: 26 mmol/L (ref 20–29)
Calcium: 9.5 mg/dL (ref 8.7–10.3)
Chloride: 104 mmol/L (ref 96–106)
Creatinine, Ser: 0.73 mg/dL (ref 0.57–1.00)
Glucose: 97 mg/dL (ref 70–99)
Potassium: 4.4 mmol/L (ref 3.5–5.2)
Sodium: 142 mmol/L (ref 134–144)
eGFR: 86 mL/min/{1.73_m2} (ref >59)

## 2021-08-26 ENCOUNTER — Other Ambulatory Visit: Payer: Self-pay | Admitting: Family Medicine

## 2021-08-26 DIAGNOSIS — Z1231 Encounter for screening mammogram for malignant neoplasm of breast: Secondary | ICD-10-CM

## 2021-08-27 ENCOUNTER — Telehealth (HOSPITAL_COMMUNITY): Payer: Self-pay | Admitting: *Deleted

## 2021-08-27 NOTE — Telephone Encounter (Signed)
Reaching out to patient to offer assistance regarding upcoming cardiac imaging study; pt verbalizes understanding of appt date/time, parking situation and where to check in, pre-test NPO status; name and call back number provided for further questions should they arise ? ?Gordy Clement RN Navigator Cardiac Imaging ?[facility] ?[phone] office ?[phone] cell ? ?Patient aware to arrive at 9:30am for her 10am scan. ?

## 2021-08-31 ENCOUNTER — Ambulatory Visit (HOSPITAL_COMMUNITY)
Admission: RE | Admit: 2021-08-31 | Discharge: 2021-08-31 | Disposition: A | Discharge status: Home / Self Care | Payer: Medicare HMO | Source: Ambulatory Visit | Attending: Cardiology | Admitting: Cardiology

## 2021-08-31 ENCOUNTER — Ambulatory Visit (HOSPITAL_COMMUNITY)
Admission: RE | Admit: 2021-08-31 | Discharge: 2021-08-31 | Disposition: A | Discharge status: Home / Self Care | Payer: Medicare HMO | Source: Ambulatory Visit | Attending: Internal Medicine | Admitting: Internal Medicine

## 2021-08-31 ENCOUNTER — Other Ambulatory Visit: Payer: Self-pay

## 2021-08-31 ENCOUNTER — Other Ambulatory Visit (HOSPITAL_COMMUNITY): Payer: Self-pay | Admitting: Emergency Medicine

## 2021-08-31 DIAGNOSIS — R079 Chest pain, unspecified: Secondary | ICD-10-CM | POA: Diagnosis present

## 2021-08-31 DIAGNOSIS — R931 Abnormal findings on diagnostic imaging of heart and coronary circulation: Secondary | ICD-10-CM

## 2021-08-31 DIAGNOSIS — I2510 Atherosclerotic heart disease of native coronary artery without angina pectoris: Secondary | ICD-10-CM | POA: Diagnosis not present

## 2021-08-31 DIAGNOSIS — R072 Precordial pain: Secondary | ICD-10-CM | POA: Insufficient documentation

## 2021-08-31 MED ORDER — NITROGLYCERIN 0.4 MG SL SUBL
0.8000 mg | SUBLINGUAL_TABLET | Freq: Once | SUBLINGUAL | Status: AC
  Administered 2021-08-31: 0.8 mg via SUBLINGUAL

## 2021-08-31 MED ORDER — NITROGLYCERIN 0.4 MG SL SUBL
SUBLINGUAL_TABLET | SUBLINGUAL | Status: AC
  Filled 2021-08-31: qty 2

## 2021-08-31 MED ORDER — IOHEXOL 350 MG/ML SOLN
95.0000 mL | Freq: Once | INTRAVENOUS | Status: AC | PRN
  Administered 2021-08-31: 95 mL via INTRAVENOUS

## 2021-09-02 DIAGNOSIS — R931 Abnormal findings on diagnostic imaging of heart and coronary circulation: Secondary | ICD-10-CM | POA: Diagnosis not present

## 2021-09-29 ENCOUNTER — Ambulatory Visit
Admission: RE | Admit: 2021-09-29 | Discharge: 2021-09-29 | Disposition: A | Discharge status: Home / Self Care | Payer: Medicare HMO | Source: Ambulatory Visit | Attending: Family Medicine | Admitting: Family Medicine

## 2021-09-29 DIAGNOSIS — Z1231 Encounter for screening mammogram for malignant neoplasm of breast: Secondary | ICD-10-CM

## 2021-10-12 NOTE — Progress Notes (Signed)
?Cardiology Office Note:   ? ?Date:  10/14/2021  ? ?ID:  Kylie Brown, DOB Jun 05, 1946, MRN [id number] ? ?PCP:  Kathyrn Lass, MD ?  ?[facility] Providers ?Cardiologist:  Werner Lean, MD    ? ?Referring MD: Kathyrn Lass, MD  ? ?CC: Mild CAD f/u ? ?History of Present Illness:   ? ?Kylie Brown is a 76 y.o. female with a hx of HTN and DM, Morbid Obesity, OSA , prior history of DVT.  who present for evaluation 07/21/21. ?Had Echo (WNL), Ziopatch (no chronotropic incompetence) and Cardiac CT (mild non obstructive CAD FFR negative). ? ?Patient notes that she is doing better.   ?There are no interval hospital/ED visit.   ? ?No chest pain or pressure.  No SOB; still have some DOE and no PND/Orthopnea.  No weight gain or leg swelling.  No palpitations or syncope. ? ?Moved into a new home and had no issues ? ?Past Medical History:  ?Diagnosis Date  ? Arthritis   ? Bradycardia   ? Diabetes ([place])   ? Hypertension   ? Sleep apnea   ? ? ?Past Surgical History:  ?Procedure Laterality Date  ? [place]  ? REPLACEMENT TOTAL KNEE BILATERAL    ? TOTAL ABDOMINAL HYSTERECTOMY    ? ? ?Current Medications: ?Current Meds  ?Medication Sig  ? Acetaminophen 500 MG capsule Take by mouth as needed.  ? amLODipine (NORVASC) 5 MG tablet Take 5 mg by mouth daily.  ? aspirin 81 MG EC tablet Take 81 mg by mouth daily. Swallow whole.  ? cetirizine (ZYRTEC) 5 MG tablet Take 5 mg by mouth daily.  ? cholecalciferol (VITAMIN D3) 25 MCG (1000 UNIT) tablet Take 1,000 Units by mouth daily.  ? esomeprazole (NEXIUM) 40 MG capsule Take 40 mg by mouth daily at 12 noon.  ? fluticasone (FLONASE) 50 MCG/ACT nasal spray as needed.  ? fosinopril (MONOPRIL) 20 MG tablet Take 20 mg by mouth daily.  ? hydrochlorothiazide (HYDRODIURIL) 12.5 MG tablet Take 12.5 mg by mouth daily.  ? ibuprofen (ADVIL) 600 MG tablet Take 1 tablet (600 mg total) by mouth every 8 (eight) hours as needed for up to 30 doses for mild pain  or moderate pain. Take with food  ? meclizine (ANTIVERT) 25 MG tablet Take 1 tablet (25 mg total) by mouth 3 (three) times daily as needed for dizziness.  ? meloxicam (MOBIC) 15 MG tablet Take 15 mg by mouth as needed.  ? metFORMIN (GLUCOPHAGE-XR) 500 MG 24 hr tablet Take 500 mg by mouth 2 (two) times daily.  ? rosuvastatin (CRESTOR) 5 MG tablet Take 5 mg by mouth daily.  ?  ? ?Allergies:   Dexlansoprazole  ? ?Social History  ? ?Socioeconomic History  ? Marital status: Single  ?  Spouse name: Not on file  ? Number of children: Not on file  ? Years of education: Not on file  ? Highest education level: Not on file  ?Occupational History  ? Not on file  ?Tobacco Use  ? Smoking status: Never  ? Smokeless tobacco: Never  ?Vaping Use  ? Vaping Use: Never used  ?Substance and Sexual Activity  ? Alcohol use: Not Currently  ? Drug use: Never  ? Sexual activity: Not Currently  ?  Birth control/protection: Post-menopausal  ?Other Topics Concern  ? Not on file  ?Social History Narrative  ? Not on file  ? ?Social Determinants of Health  ? ?Financial Resource Strain: Not on file  ?  Food Insecurity: Not on file  ?Transportation Needs: Not on file  ?Physical Activity: Not on file  ?Stress: Not on file  ?Social Connections: Not on file  ?  ?Social: Husband passed from CHF, sister comes to visit ? ?Family History: ?The patient's family history includes Bone cancer in her father; Breast cancer in her sister; Diabetes in her mother; Heart failure in her mother; Hypertension in her mother; Lung cancer in her brother. ? ?ROS:   ?Please see the history of present illness.    ? All other systems reviewed and are negative. ? ?EKGs/Labs/Other Studies Reviewed:   ? ?The following studies were reviewed today: ? ?EKG:   ?07/02/21:  Sinus bradycardia rate 56 no PR prolongation or heart block. ? ?Recent Labs: ?07/01/2021: Hemoglobin 12.1; Platelets 272 ?08/23/2021: BUN 11; Creatinine, Ser 0.73; Potassium 4.4; Sodium 142  ?Recent Lipid Panel ?No results  found for: CHOL, TRIG, HDL, CHOLHDL, VLDL, LDLCALC, LDLDIRECT ? ?    ? ?Physical Exam:   ? ?VS:  BP 130/70 (BP Location: Left Arm, Patient Position: Sitting, Cuff Size: Normal)   Pulse (!) 55   Ht '5\' 6"'$  (1.676 m)   Wt 232 lb (105.2 kg)   SpO2 96%   BMI 37.45 kg/m?    ? ?Wt Readings from Last 3 Encounters:  ?10/14/21 232 lb (105.2 kg)  ?07/21/21 241 lb (109.3 kg)  ?12/30/20 252 lb (114.3 kg)  ?  ?Gen: no distress, morbid obesity  ?Neck: No JVD ?Cardiac: No Rubs or Gallops, no Murmur, RRR +2radial pulses ?Respiratory: Clear to auscultation bilaterally, normal effort, normal  respiratory rate ?GI: Soft, nontender, non-distended  ?MS: No  edema;  moves all extremities ?Integument: Skin feels warm ?Neuro:  At time of evaluation, alert and oriented to person/place/time/situation  ?Psych: Normal affect, patient feels well ? ? ?ASSESSMENT:   ? ?1. Hypertension associated with diabetes ([place])   ?2. Morbid obesity ([place])   ?3. Coronary artery disease involving native coronary artery of native heart without angina pectoris   ? ? ?PLAN:   ? ?HTN with DM ?Morbid Obesity ?Mild non obstructive coronary artery disease ?SOB ?- Asa 81 mg PO daily ?- LDL is < 70, continue rosuvastatin ?- amlodipine 5 mg PO daily ?- monopril 20 mg PO daily  ?- HCTZ  12.5 mg PO daily ?- if she has issues with fatigue and weakness on her treadmill but can tolerate being on with her hips, we will get POET (already consented) ?- if breathing issues she will reach to PCP for potential pulmonary work up ?- Reviewed CT with patient and sister ? ?Exercise Prescription ? ?Frequency: three days per week to start  ?Intensity: 60 to 90 percent of heart rate reserve. ?Time: 20 minutes per session with 5 min increase per week ?Type: walking or cycling  ?Volume: goal of 60 minute sessions ?Progression: gradually increase the intensity or duration of exercise. ? ?One year with me ? ? ? ?Medication Adjustments/Labs and Tests Ordered: ?Current medicines are reviewed  at length with the patient today.  Concerns regarding medicines are outlined above.  ?No orders of the defined types were placed in this encounter. ? ?No orders of the defined types were placed in this encounter. ? ? ?Patient Instructions  ?Medication Instructions:  ?Your physician recommends that you continue on your current medications as directed. Please refer to the Current Medication list given to you today. ? ?*If you need a refill on your cardiac medications before your next appointment, please call your pharmacy* ? ? ?  Lab Work: ?NONE ?If you have labs (blood work) drawn today and your tests are completely normal, you will receive your results only by: ?MyChart Message (if you have MyChart) OR ?A paper copy in the mail ?If you have any lab test that is abnormal or we need to change your treatment, we will call you to review the results. ? ? ?Testing/Procedures: ?NONE ? ? ?Follow-Up: ?At [facility], you and your health needs are our priority.  As part of our continuing mission to provide you with exceptional heart care, we have created designated Provider Care Teams.  These Care Teams include your primary Cardiologist (physician) and Advanced Practice Providers (APPs -  Physician Assistants and Nurse Practitioners) who all work together to provide you with the care you need, when you need it. ? ?We recommend signing up for the patient portal called "MyChart".  Sign up information is provided on this After Visit Summary.  MyChart is used to connect with patients for Virtual Visits (Telemedicine).  Patients are able to view lab/test results, encounter notes, upcoming appointments, etc.  Non-urgent messages can be sent to your provider as well.   ?To learn more about what you can do with MyChart, go to [email or web address].   ? ?Your next appointment:   ?1 year(s) ? ?The format for your next appointment:   ?In Person ? ?Provider:   ?Werner Lean, MD  or Melina Copa, PA-C, Ermalinda Barrios, PA-C, or  Christen Bame, NP      ? ?Important Information About [place] ? ? ? ? ?   ? ?Signed, ?Werner Lean, MD  ?10/14/2021 9:39 AM    ?[place] ? ? ?

## 2021-10-14 ENCOUNTER — Ambulatory Visit (INDEPENDENT_AMBULATORY_CARE_PROVIDER_SITE_OTHER): Payer: Medicare HMO | Admitting: Internal Medicine

## 2021-10-14 ENCOUNTER — Encounter: Payer: Self-pay | Admitting: Internal Medicine

## 2021-10-14 VITALS — BP 130/70 | HR 55 | Ht 66.0 in | Wt 232.0 lb

## 2021-10-14 DIAGNOSIS — I2510 Atherosclerotic heart disease of native coronary artery without angina pectoris: Secondary | ICD-10-CM | POA: Diagnosis not present

## 2021-10-14 DIAGNOSIS — I25110 Atherosclerotic heart disease of native coronary artery with unstable angina pectoris: Secondary | ICD-10-CM | POA: Insufficient documentation

## 2021-10-14 DIAGNOSIS — E1159 Type 2 diabetes mellitus with other circulatory complications: Secondary | ICD-10-CM

## 2021-10-14 DIAGNOSIS — I152 Hypertension secondary to endocrine disorders: Secondary | ICD-10-CM

## 2021-10-14 NOTE — Patient Instructions (Signed)
Medication Instructions:  Your physician recommends that you continue on your current medications as directed. Please refer to the Current Medication list given to you today.  *If you need a refill on your cardiac medications before your next appointment, please call your pharmacy*   Lab Work: NONE If you have labs (blood work) drawn today and your tests are completely normal, you will receive your results only by: MyChart Message (if you have MyChart) OR A paper copy in the mail If you have any lab test that is abnormal or we need to change your treatment, we will call you to review the results.   Testing/Procedures: NONE   Follow-Up: At [facility], you and your health needs are our priority.  As part of our continuing mission to provide you with exceptional heart care, we have created designated Provider Care Teams.  These Care Teams include your primary Cardiologist (physician) and Advanced Practice Providers (APPs -  Physician Assistants and Nurse Practitioners) who all work together to provide you with the care you need, when you need it.  We recommend signing up for the patient portal called "MyChart".  Sign up information is provided on this After Visit Summary.  MyChart is used to connect with patients for Virtual Visits (Telemedicine).  Patients are able to view lab/test results, encounter notes, upcoming appointments, etc.  Non-urgent messages can be sent to your provider as well.   To learn more about what you can do with MyChart, go to [email or web address].    Your next appointment:   1 year(s)  The format for your next appointment:   In Person  Provider:   Mahesh A Chandrasekhar, MD  or Dayna Dunn, PA-C, Michele Lenze, PA-C, or Michelle Swinyer, NP       Important Information About [place]       

## 2021-12-24 ENCOUNTER — Ambulatory Visit (INDEPENDENT_AMBULATORY_CARE_PROVIDER_SITE_OTHER): Payer: Medicare HMO

## 2021-12-24 ENCOUNTER — Ambulatory Visit (INDEPENDENT_AMBULATORY_CARE_PROVIDER_SITE_OTHER): Payer: Medicare HMO | Admitting: Podiatry

## 2021-12-24 ENCOUNTER — Encounter: Payer: Self-pay | Admitting: Podiatry

## 2021-12-24 DIAGNOSIS — L600 Ingrowing nail: Secondary | ICD-10-CM

## 2021-12-24 DIAGNOSIS — S99922A Unspecified injury of left foot, initial encounter: Secondary | ICD-10-CM

## 2021-12-24 NOTE — Progress Notes (Signed)
.  tfc  

## 2021-12-24 NOTE — Patient Instructions (Signed)

## 2021-12-26 NOTE — Progress Notes (Signed)
Subjective:   Patient ID: Kylie Brown, female   DOB: 76 y.o.   MRN: [id number]   HPI Patient presents stating she dropped a bed on her left big toenail and toe and its been very sore and it has a lot of redness and drainage and it and has been hard for her to wear shoe gear.  Patient stated this occurred a week ago does not smoke likes to be active   Review of Systems  All other systems reviewed and are negative.       Objective:  Physical Exam Vitals and nursing note reviewed.  Constitutional:      Appearance: She is well-developed.  Pulmonary:     Effort: Pulmonary effort is normal.  Musculoskeletal:        General: Normal range of motion.  Skin:    General: Skin is warm.  Neurological:     Mental Status: She is alert.     Neurovascular status found to be intact muscle strength was found to be adequate range of motion adequate.  Patient has a severely traumatized left hallux nailbed and into the inner phalangeal joint with redness soreness and lifting of the nail with bleeding underneath the nailbed.  Good digital perfusion well oriented x3     Assessment:  Traumatized left hallux digit itself and the nailbed which is loosened from the underlying bed eft H&P x-ray reviewed today I went ahead and anesthetized the left hallux remove the nail entirely there was quite a bit of bleeding underneath I flushed out the bed there was no cuts of the nail bed itself it appears to be stable and I applied sterile dressing.  Patient will be seen back as needed open toed shoes recommended for the next several weeks  X-rays negative for signs of fracture or bony injury with condition     Plan:  Everything listed above

## 2021-12-29 ENCOUNTER — Telehealth: Payer: Self-pay | Admitting: *Deleted

## 2021-12-29 NOTE — Telephone Encounter (Signed)
"  I had a toenail removed last Friday with Dr. Paulla Dolly.  I think I need some oral antibiotics to take.  Thank you, I'll be waiting for your call."

## 2021-12-30 ENCOUNTER — Encounter: Payer: Self-pay | Admitting: Podiatry

## 2021-12-30 ENCOUNTER — Other Ambulatory Visit: Payer: Self-pay | Admitting: Podiatry

## 2021-12-30 ENCOUNTER — Ambulatory Visit (INDEPENDENT_AMBULATORY_CARE_PROVIDER_SITE_OTHER): Payer: Medicare HMO | Admitting: Podiatry

## 2021-12-30 DIAGNOSIS — L600 Ingrowing nail: Secondary | ICD-10-CM | POA: Diagnosis not present

## 2021-12-30 DIAGNOSIS — S99922A Unspecified injury of left foot, initial encounter: Secondary | ICD-10-CM | POA: Diagnosis not present

## 2021-12-30 MED ORDER — DOXYCYCLINE HYCLATE 100 MG PO TABS: 100.0000 mg | ORAL_TABLET | Freq: Two times a day (BID) | ORAL | 1 refills | Status: DC

## 2021-12-30 NOTE — Telephone Encounter (Signed)
Sent in antibiotic. Please check on her to make sure she is doing ok

## 2021-12-31 NOTE — Progress Notes (Signed)
Subjective:   Patient ID: Dot Lanes, female   DOB: 76 y.o.   MRN: [id number]   HPI Patient presents concerned about the appearance of the left hallux nailbed states there is still some darkness and some drainage and she is a diabetic and wants it looked at   ROS      Objective:  Physical Exam  Neurovascular status intact patient is found to have mild proximal drainage localized within the left hallux and there is some darkness to the proximal portion localized to this area.  I did not note any proximal edema erythema beyond this and there is no systemic signs of infection     Assessment:  Possibility for low-grade localized paronychia infection but appears to be more of a generalized healing process from previous procedure     Plan:  H&P reviewed condition recommended soaks recommended continued bandage usage when out and this should heal uneventfully.  If increased were to occur patient is to be seen back or to call us

## 2022-06-17 ENCOUNTER — Other Ambulatory Visit: Payer: Self-pay

## 2022-06-17 ENCOUNTER — Emergency Department (HOSPITAL_COMMUNITY)
Admission: EM | Admit: 2022-06-17 | Discharge: 2022-06-17 | Disposition: A | Discharge status: Home / Self Care | Payer: Medicare HMO | Attending: Emergency Medicine | Admitting: Emergency Medicine

## 2022-06-17 ENCOUNTER — Emergency Department (HOSPITAL_COMMUNITY): Payer: Medicare HMO

## 2022-06-17 ENCOUNTER — Encounter (HOSPITAL_COMMUNITY): Payer: Self-pay

## 2022-06-17 DIAGNOSIS — Z79899 Other long term (current) drug therapy: Secondary | ICD-10-CM | POA: Diagnosis not present

## 2022-06-17 DIAGNOSIS — U071 COVID-19: Secondary | ICD-10-CM | POA: Insufficient documentation

## 2022-06-17 DIAGNOSIS — I10 Essential (primary) hypertension: Secondary | ICD-10-CM | POA: Insufficient documentation

## 2022-06-17 DIAGNOSIS — E119 Type 2 diabetes mellitus without complications: Secondary | ICD-10-CM | POA: Diagnosis not present

## 2022-06-17 DIAGNOSIS — Z7982 Long term (current) use of aspirin: Secondary | ICD-10-CM | POA: Insufficient documentation

## 2022-06-17 DIAGNOSIS — Z7984 Long term (current) use of oral hypoglycemic drugs: Secondary | ICD-10-CM | POA: Insufficient documentation

## 2022-06-17 DIAGNOSIS — I2510 Atherosclerotic heart disease of native coronary artery without angina pectoris: Secondary | ICD-10-CM | POA: Insufficient documentation

## 2022-06-17 DIAGNOSIS — R059 Cough, unspecified: Secondary | ICD-10-CM | POA: Diagnosis present

## 2022-06-17 LAB — RESP PANEL BY RT-PCR (RSV, FLU A&B, COVID)  RVPGX2
Influenza A by PCR: NEGATIVE
Influenza B by PCR: NEGATIVE
Resp Syncytial Virus by PCR: NEGATIVE
SARS Coronavirus 2 by RT PCR: POSITIVE — AB

## 2022-06-17 MED ORDER — MOLNUPIRAVIR EUA 200MG CAPSULE: 4.0000 | ORAL_CAPSULE | Freq: Two times a day (BID) | ORAL | 0 refills | Status: AC

## 2022-06-17 NOTE — Discharge Instructions (Signed)
Thank you for coming to [facility] Emergency Department. You were seen for cough, body aches. We did an exam, labs, and imaging, and these showed positive covid-19. You will be prescribed Malnupiravir to take 800 mg twice per day for 5 days to help prevent the progression of severe covid-19 disease. You can alternate taking Tylenol and ibuprofen as needed for body aches and fever. You can take '650mg'$  tylenol (acetaminophen) every 4-6 hours, and 600 mg ibuprofen 3 times a day. You can also take over the counter cough medicine such as mucinex or robitussin.   Please follow up with your primary care provider within 1-2 weeks.   Do not hesitate to return to the ED or call 911 if you experience: -Worsening symptoms -Shortness of breath, chest pain -Lightheadedness, passing out -Fevers/chills -Anything else that concerns you

## 2022-06-17 NOTE — ED Provider Triage Note (Signed)
Emergency Medicine Provider Triage Evaluation Note  Kylie Brown , a 76 y.o. female  was evaluated in triage.  Pt complains of general malaise and cough for 2 days.  She reports that she recently had several losses in the family and this past week began to feel ill several days ago but cannot pinpoint the exact time.  She reports feeling every bone and joint in her body is sore and aching.  She denies any shortness of breath, difficulty breathing on exertion, chest pain, swelling, fever, abdominal pain, nausea, diarrhea..  Review of Systems  Positive: See above Negative:  Physical Exam  BP (!) 172/61 (BP Location: Left Arm)   Pulse 70   Temp 99.3 F (37.4 C) (Oral)   Resp 18   SpO2 100%  Gen:   Awake, no distress Resp:  Normal effort, clear to auscultation bilaterally MSK:   Moves extremities without difficulty Other:    Medical Decision Making  Medically screening exam initiated at 9:09 AM.  Appropriate orders placed.  Dot Lanes was informed that the remainder of the evaluation will be completed by another provider, this initial triage assessment does not replace that evaluation, and the importance of remaining in the ED until their evaluation is complete.     Luvenia Heller, PA-C 06/17/22 [phone]

## 2022-06-17 NOTE — ED Provider Notes (Signed)
[facility] Provider Note   CSN: [id number] Arrival date & time: 06/17/22  0901     History  Chief Complaint  Patient presents with   Generalized Body Aches   Cough    Kylie Brown is a 76 y.o. female with CAD, HTN, obesity, T2DM, lumbar radiculopathy, HLD, spinal stenosis, GERD, h/o VDT who presents with generalized bodyaches, cough x 2 days after she went to a funeral a few days ago.  She states that several people in the family were diagnosed with COVID.   2 days of nonproductive cough and generalized body aches. Patient attended a funeral recently after which several family members were diagnosed with COVID.  She endorses chest pain only when she coughs as well as headache when she coughs.  Denies any hemoptysis, chest pain, shortness of breath, abdominal pain, and nausea vomiting diarrhea, dysuria/hematuria, leg swelling, sore throat, headache, blurry vision/double vision, wheezing. Does endorse intermittent f/c, with T 101F max yesterday.  Cough      Home Medications Prior to Admission medications   Medication Sig Start Date End Date Taking? Authorizing Provider  molnupiravir EUA (LAGEVRIO) 200 mg CAPS capsule Take 4 capsules (800 mg total) by mouth 2 (two) times daily for 5 days. 06/17/22 06/22/22 Yes Audley Hose, MD  Acetaminophen 500 MG capsule Take by mouth as needed.    [provider]  amLODipine (NORVASC) 5 MG tablet Take 5 mg by mouth daily.    [provider]  aspirin 81 MG EC tablet Take 81 mg by mouth daily. Swallow whole.    [provider]  cetirizine (ZYRTEC) 5 MG tablet Take 5 mg by mouth daily.    [provider]  cholecalciferol (VITAMIN D3) 25 MCG (1000 UNIT) tablet Take 1,000 Units by mouth daily.    [provider]  doxycycline (VIBRA-TABS) 100 MG tablet Take 1 tablet (100 mg total) by mouth 2 (two) times daily. 12/30/21   Wallene Huh, DPM   esomeprazole (NEXIUM) 40 MG capsule Take 40 mg by mouth daily at 12 noon.    [provider]  fluticasone (FLONASE) 50 MCG/ACT nasal spray as needed. 06/29/21   [provider]  fosinopril (MONOPRIL) 20 MG tablet Take 20 mg by mouth daily.    [provider]  hydrochlorothiazide (HYDRODIURIL) 12.5 MG tablet Take 12.5 mg by mouth daily.    [provider]  meclizine (ANTIVERT) 25 MG tablet Take 1 tablet (25 mg total) by mouth 3 (three) times daily as needed for dizziness. 12/04/18   Henderly, Britni A, PA-C  meloxicam (MOBIC) 15 MG tablet Take 15 mg by mouth as needed. 06/27/21   [provider]  metFORMIN (GLUCOPHAGE-XR) 500 MG 24 hr tablet Take 500 mg by mouth 2 (two) times daily. 07/20/21   [provider]  rosuvastatin (CRESTOR) 5 MG tablet Take 5 mg by mouth daily.    [provider]      Allergies    Dexlansoprazole    Review of Systems   Review of Systems  Respiratory:  Positive for cough.    Review of systems Positive for f/c.  A 10 point review of systems was performed and is negative unless otherwise reported in HPI.  Physical Exam Updated Vital Signs BP (!) 172/61 (BP Location: Left Arm)   Pulse 70   Temp 99.3 F (37.4 C) (Oral)   Resp 18   Ht '5\' 6"'$  (1.676 m)   Wt 105.2 kg  SpO2 100%   BMI 37.45 kg/m  Physical Exam General: Very well appearing female, lying in bed.  HEENT: Sclera anicteric, MMM, trachea midline.  Cardiology: RRR, no murmurs/rubs/gallops. BL radial and DP pulses equal bilaterally.  Resp: Normal respiratory rate and effort. CTAB, no wheezes, rhonchi, crackles.  Abd: Soft, non-tender, non-distended. No rebound tenderness or guarding.  GU: Deferred. MSK: No peripheral edema or signs of trauma. No cyanosis or clubbing. Skin: warm, dry. No rashes or lesions. Neuro: A&Ox4, CNs II-XII grossly intact. MAEs. Sensation grossly intact.  Psych: Normal mood and affect.   ED Results / Procedures /  Treatments   Labs (all labs ordered are listed, but only abnormal results are displayed) Labs Reviewed  RESP PANEL BY RT-PCR (RSV, FLU A&B, COVID)  [id number] - Abnormal; Notable for the following components:      Result Value   SARS Coronavirus 2 by RT PCR POSITIVE (*)    All other components within normal limits    EKG EKG Interpretation  Date/Time:  Friday June 17 2022 09:11:43 EST Ventricular Rate:  62 PR Interval:  124 QRS Duration: 90 QT Interval:  400 QTC Calculation: 406 R Axis:   49 Text Interpretation: Normal sinus rhythm Nonspecific ST abnormality Similar to prior When compared with ECG of 01-Jul-2021 15:47, PREVIOUS ECG IS PRESENT Confirmed by Cindee Lame [phone]) on 06/17/2022 9:49:39 AM  Radiology DG Chest 2 View  Result Date: 06/17/2022 CLINICAL DATA:  Left chest wall pain EXAM: CHEST - 2 VIEW COMPARISON:  None Available. FINDINGS: The heart size and mediastinal contours are within normal limits. Atherosclerotic calcification of the aortic arch. Linear atelectasis of the right mid lung, unchanged. Lungs are otherwise clear without evidence of focal consolidation or pleural effusion. Mild thoracic spondylosis and bilateral glenohumeral osteoarthritis. IMPRESSION: No active cardiopulmonary disease. Electronically Signed   By: Keane Police D.O.   On: 06/17/2022 09:46    Procedures Procedures    Medications Ordered in ED Medications - No data to display  ED Course/ Medical Decision Making/ A&P                          Medical Decision Making Amount and/or Complexity of Data Reviewed Labs:  Decision-making details documented in ED Course. Radiology:  Decision-making details documented in ED Course.    MDM:    Patient with concern for bodyaches, cough, viral-like syndrome after COVID-19 exposure.  Consider COVID flu RSV at the top of differential obtain swabs.  Given cough and fever will also obtain x-rays to evaluate for pneumonia.  Patient with no  respiratory stress or shortness of breath, overall well-appearing and nontoxic.  She is vitally stable and afebrile currently.  Patient does report some chest pain when she coughs which is overall very low concern for ACS but will obtain EKG.  Clinical Course as of 06/17/22 1455  Fri Jun 17, 2022  1403 SARS Coronavirus 2 by RT PCR(!): POSITIVE [HN]  1403 DG Chest 2 View FINDINGS: The heart size and mediastinal contours are within normal limits. Atherosclerotic calcification of the aortic arch. Linear atelectasis of the right mid lung, unchanged. Lungs are otherwise clear without evidence of focal consolidation or pleural effusion. Mild thoracic spondylosis and bilateral glenohumeral osteoarthritis.  IMPRESSION: No active cardiopulmonary disease.   [HN]    Clinical Course User Index [HN] Audley Hose, MD     Labs: I Ordered, and personally interpreted labs.  The pertinent results include: Positive COVID  Imaging  Studies ordered: I ordered imaging studies including chest x-ray I independently visualized and interpreted imaging. I agree with the radiologist interpretation  Social Determinants of Health: Patient lives independently   Disposition: COVID-positive.  Chest x-ray negative.  Patient requests an antiviral medication. Patient symptoms started 2 days ago and she is within 5 days of symptom onset so we will prescribe molnupiravir, as her age makes her high risk COVID-19 infection.  She is currently vitally stable and well-appearing, no evidence of respiratory distress, I considered admission for this diagnosis but do not believe it is necessary at this time.  Patient is instructed to quarantine and take over-the-counter remedies including Mucinex and Tylenol/ibuprofen for her body aches.  DC with prescription for molnupiravir, discharge instructions and return precautions.  Instructed to follow-up with PCP in 2 weeks.  Co morbidities that complicate the patient  evaluation  Past Medical History:  Diagnosis Date   Arthritis    Bradycardia    Diabetes ([place])    Hypertension    Sleep apnea      Medicines Meds ordered this encounter  Medications   molnupiravir EUA (LAGEVRIO) 200 mg CAPS capsule    Sig: Take 4 capsules (800 mg total) by mouth 2 (two) times daily for 5 days.    Dispense:  40 capsule    Refill:  0    I have reviewed the patients home medicines and have made adjustments as needed  Problem List / ED Course: Problem List Items Addressed This Visit   None Visit Diagnoses     COVID-19    -  Primary   Relevant Medications   molnupiravir EUA (LAGEVRIO) 200 mg CAPS capsule                 This note was created using dictation software, which may contain spelling or grammatical errors.    Audley Hose, MD 06/17/22 [phone]

## 2022-06-17 NOTE — ED Triage Notes (Signed)
Pt arrived POV from home c/o generalized body aches and a cough x2 days.

## 2022-08-31 ENCOUNTER — Other Ambulatory Visit: Payer: Self-pay | Admitting: Family Medicine

## 2022-08-31 DIAGNOSIS — Z1231 Encounter for screening mammogram for malignant neoplasm of breast: Secondary | ICD-10-CM

## 2022-10-12 ENCOUNTER — Ambulatory Visit
Admission: RE | Admit: 2022-10-12 | Discharge: 2022-10-12 | Disposition: A | Discharge status: Home / Self Care | Payer: Medicare HMO | Source: Ambulatory Visit | Attending: Family Medicine | Admitting: Family Medicine

## 2022-10-12 DIAGNOSIS — Z1231 Encounter for screening mammogram for malignant neoplasm of breast: Secondary | ICD-10-CM

## 2022-11-30 ENCOUNTER — Encounter: Payer: Self-pay | Admitting: Internal Medicine

## 2022-11-30 ENCOUNTER — Ambulatory Visit: Payer: Medicare HMO | Attending: Internal Medicine | Admitting: Internal Medicine

## 2022-11-30 VITALS — BP 155/66 | HR 53 | Ht 66.0 in | Wt 232.8 lb

## 2022-11-30 DIAGNOSIS — R5383 Other fatigue: Secondary | ICD-10-CM

## 2022-11-30 DIAGNOSIS — E1159 Type 2 diabetes mellitus with other circulatory complications: Secondary | ICD-10-CM

## 2022-11-30 DIAGNOSIS — Z7984 Long term (current) use of oral hypoglycemic drugs: Secondary | ICD-10-CM

## 2022-11-30 DIAGNOSIS — I152 Hypertension secondary to endocrine disorders: Secondary | ICD-10-CM

## 2022-11-30 DIAGNOSIS — I25110 Atherosclerotic heart disease of native coronary artery with unstable angina pectoris: Secondary | ICD-10-CM

## 2022-11-30 DIAGNOSIS — I700 Atherosclerosis of aorta: Secondary | ICD-10-CM

## 2022-11-30 MED ORDER — HYDROCHLOROTHIAZIDE 25 MG PO TABS
25.0000 mg | ORAL_TABLET | Freq: Every day | ORAL | 3 refills | Status: AC
Start: 2022-11-30 — End: 2023-12-18

## 2022-11-30 NOTE — Progress Notes (Signed)
Cardiology Office Note:    Date:  11/30/2022   ID:  Kylie Brown, DOB 12-Sep-1945, MRN [id number]  PCP:  Sigmund Hazel, MD   [facility] Providers Cardiologist:  Christell Constant, MD     Referring MD: Sigmund Hazel, MD   CC: Mild CAD f/u  History of Present Illness:    Kylie Brown is a 77 y.o. female with a hx of HTN and DM, Morbid Obesity, OSA , prior history of DVT.  who present for evaluation 07/21/21. Had Echo (WNL), Ziopatch (no chronotropic incompetence) and Cardiac CT (mild non obstructive CAD FFR negative). 2023: Sx improved.  Exercise prescription attempted  Patient notes that she is doing poorly. She was not able to do exercise as we had planned because of exertional fatigue and diaphoresis. Notes fatigue at rest too.   There are no interval hospital/ED visit.    Has discomfort in her chest with exertion..  No SOB.  Has DOE at times. Notes PND/Orthopnea.  No weight gain.  No leg swelling.Marland Kitchen  No palpitations or syncope . Compliant with OSA mask.   Past Medical History:  Diagnosis Date   Arthritis    Bradycardia    Diabetes (HCC)    Hypertension    Sleep apnea     Past Surgical History:  Procedure Laterality Date   BACK SURGERY  1975   REPLACEMENT TOTAL KNEE BILATERAL     TOTAL ABDOMINAL HYSTERECTOMY      Current Medications: Current Meds  Medication Sig   Acetaminophen 500 MG capsule Take by mouth as needed.   amLODipine (NORVASC) 5 MG tablet Take 5 mg by mouth daily.   aspirin 81 MG EC tablet Take 81 mg by mouth daily. Swallow whole.   cetirizine (ZYRTEC) 5 MG tablet Take 5 mg by mouth daily.   cholecalciferol (VITAMIN D3) 25 MCG (1000 UNIT) tablet Take 1,000 Units by mouth daily.   doxycycline (VIBRA-TABS) 100 MG tablet Take 1 tablet (100 mg total) by mouth 2 (two) times daily.   fluticasone (FLONASE) 50 MCG/ACT nasal spray as needed.   fosinopril (MONOPRIL) 20 MG tablet Take 20 mg by mouth daily.    hydrochlorothiazide (HYDRODIURIL) 25 MG tablet Take 1 tablet (25 mg total) by mouth daily.   meloxicam (MOBIC) 15 MG tablet Take 15 mg by mouth as needed.   metFORMIN (GLUCOPHAGE-XR) 500 MG 24 hr tablet Take 500 mg by mouth 2 (two) times daily.   pantoprazole (PROTONIX) 40 MG tablet Take 40 mg by mouth 2 (two) times daily.   rosuvastatin (CRESTOR) 5 MG tablet Take 5 mg by mouth daily.   [DISCONTINUED] hydrochlorothiazide (HYDRODIURIL) 12.5 MG tablet Take 12.5 mg by mouth daily.     Allergies:   Dexlansoprazole   Social History   Socioeconomic History   Marital status: Single    Spouse name: Not on file   Number of children: Not on file   Years of education: Not on file   Highest education level: Not on file  Occupational History   Not on file  Tobacco Use   Smoking status: Never   Smokeless tobacco: Never  Vaping Use   Vaping Use: Never used  Substance and Sexual Activity   Alcohol use: Not Currently   Drug use: Never   Sexual activity: Not Currently    Birth control/protection: Post-menopausal  Other Topics Concern   Not on file  Social History Narrative   Not on file   Social Determinants of Health  Financial Resource Strain: Not on file  Food Insecurity: Not on file  Transportation Needs: Not on file  Physical Activity: Not on file  Stress: Not on file  Social Connections: Not on file    Social: Husband passed from CHF, sister comes to visit sometimes  Family History: The patient's family history includes Bone cancer in her father; Breast cancer in her sister; Diabetes in her mother; Heart failure in her mother; Hypertension in her mother; Lung cancer in her brother.  ROS:   Please see the history of present illness.     All other systems reviewed and are negative.  EKGs/Labs/Other Studies Reviewed:    The following studies were reviewed today:  EKG:   07/02/21:  Sinus bradycardia rate 56 no PR prolongation or heart block.  Cardiac Studies & Procedures        ECHOCARDIOGRAM  ECHOCARDIOGRAM COMPLETE 08/17/2021  Narrative ECHOCARDIOGRAM REPORT    Patient Name:   Kylie Brown Date of Exam: 08/17/2021 Medical Rec #:  [id number]                      Height:       66.0 in Accession #:    [id number]                     Weight:       241.0 lb Date of Birth:  1945-10-24                      BSA:          2.165 m Patient Age:    75 years                       BP:           138/60 mmHg Patient Gender: F                              HR:           52 bpm. Exam Location:  [address]  Procedure: 2D Echo, Cardiac Doppler and Color Doppler  Indications:    r07.9 Chest pain R06.02 Shortness of breath  History:        Patient has no prior history of Echocardiogram examinations. Arrythmias:Bradycardia; Risk Factors:Hypertension, Diabetes, Obesity and Sleep Apnea.  Sonographer:    Daphine Deutscher RDCS Referring Phys: [id number] [facility] A Dulcemaria Bula  IMPRESSIONS   1. Left ventricular ejection fraction, by estimation, is 60 to 65%. The left ventricle has normal function. The left ventricle has no regional wall motion abnormalities. Left ventricular diastolic parameters were normal. 2. Right ventricular systolic function is normal. The right ventricular size is normal. 3. Left atrial size was moderately dilated. 4. The mitral valve is normal in structure. Trivial mitral valve regurgitation. No evidence of mitral stenosis. 5. The aortic valve is tricuspid. There is mild calcification of the aortic valve. There is mild thickening of the aortic valve. Aortic valve regurgitation is not visualized. Aortic valve sclerosis is present, with no evidence of aortic valve stenosis. 6. The inferior vena cava is normal in size with greater than 50% respiratory variability, suggesting right atrial pressure of 3 mmHg.  FINDINGS Left Ventricle: Left ventricular ejection fraction, by estimation, is 60 to 65%. The left ventricle has normal  function. The left ventricle has no regional wall motion abnormalities. The left ventricular internal  cavity size was normal in size. There is no left ventricular hypertrophy. Left ventricular diastolic parameters were normal.  Right Ventricle: The right ventricular size is normal. No increase in right ventricular wall thickness. Right ventricular systolic function is normal.  Left Atrium: Left atrial size was moderately dilated.  Right Atrium: Right atrial size was normal in size.  Pericardium: There is no evidence of pericardial effusion.  Mitral Valve: The mitral valve is normal in structure. There is mild thickening of the mitral valve leaflet(s). Trivial mitral valve regurgitation. No evidence of mitral valve stenosis.  Tricuspid Valve: The tricuspid valve is normal in structure. Tricuspid valve regurgitation is trivial. No evidence of tricuspid stenosis.  Aortic Valve: The aortic valve is tricuspid. There is mild calcification of the aortic valve. There is mild thickening of the aortic valve. Aortic valve regurgitation is not visualized. Aortic valve sclerosis is present, with no evidence of aortic valve stenosis.  Pulmonic Valve: The pulmonic valve was normal in structure. Pulmonic valve regurgitation is not visualized. No evidence of pulmonic stenosis.  Aorta: The aortic root is normal in size and structure.  Venous: The inferior vena cava is normal in size with greater than 50% respiratory variability, suggesting right atrial pressure of 3 mmHg.  IAS/Shunts: No atrial level shunt detected by color flow Doppler.   LEFT VENTRICLE PLAX 2D LVIDd:         5.00 cm   Diastology LVIDs:         3.30 cm   LV e' medial:    6.96 cm/s LV PW:         0.90 cm   LV E/e' medial:  16.5 LV IVS:        0.60 cm   LV e' lateral:   7.40 cm/s LVOT diam:     1.90 cm   LV E/e' lateral: 15.5 LV SV:         78 LV SV Index:   36 LVOT Area:     2.84 cm   RIGHT VENTRICLE             IVC RV Basal  diam:  3.90 cm     IVC diam: 2.30 cm RV S prime:     12.90 cm/s TAPSE (M-mode): 2.7 cm  LEFT ATRIUM             Index        RIGHT ATRIUM           Index LA diam:        5.00 cm 2.31 cm/m   RA Area:     14.50 cm LA Vol (A2C):   68.7 ml 31.74 ml/m  RA Volume:   37.90 ml  17.51 ml/m LA Vol (A4C):   58.3 ml 26.93 ml/m LA Biplane Vol: 63.2 ml 29.20 ml/m AORTIC VALVE LVOT Vmax:   103.00 cm/s LVOT Vmean:  66.900 cm/s LVOT VTI:    0.274 m  AORTA Ao Root diam: 3.00 cm  MITRAL VALVE                TRICUSPID VALVE MV Area (PHT): 3.95 cm     TR Peak grad:   38.9 mmHg MV Decel Time: 192 msec     TR Vmax:        312.00 cm/s MV E velocity: 114.50 cm/s MV A velocity: 104.00 cm/s  SHUNTS MV E/A ratio:  1.10         Systemic VTI:  0.27 m Systemic Diam: 1.90 cm  Charlton Haws MD Electronically signed by Charlton Haws MD Signature Date/Time: 08/17/2021/10:22:19 AM    Final    MONITORS  LONG TERM MONITOR (3-14 DAYS) 08/22/2021  Narrative  Patient had a minimum heart rate of 38 bpm (nocturnal), maximum heart rate of 128 bpm (SVT), and average heart rate of 58 bpm.  Predominant underlying rhythm was sinus bradycardia.  Two runs of SVT occurred lasting 20 beats at longest with a max rate of 128 bpm at fastest.  Isolated PACs were rare (<1.0%).  Isolated PVCs were rare (<1.0%).  Triggered and diary events associated with sinus bradycardia, sinus rhythm, PACs, and PVCs.  No malignant arrhythmias.   CT SCANS  CT CORONARY MORPH W/CTA COR W/SCORE 08/31/2021  Addendum 08/31/2021  3:03 PM ADDENDUM REPORT: 08/31/2021 15:01  CLINICAL DATA:  Chest pain  EXAM: Cardiac CTA  MEDICATIONS: Sub lingual nitro. 4mg  x 2  TECHNIQUE: The patient was scanned on a Siemens 192 slice scanner. Gantry rotation speed was 250 msecs. Collimation was 0.6 mm. A 100 kV prospective scan was triggered in the ascending thoracic aorta at 35-75% of the R-R interval. Average HR during the scan was 60  bpm. The 3D data set was interpreted on a dedicated work station using MPR, MIP and VRT modes. A total of 80cc of contrast was used.  FINDINGS: Non-cardiac: See separate report from [facility].  No LA appendage thrombus. Pulmonary veins drain normally to the left atrium.  Calcium Score: 34 Agatston units.  Coronary Arteries: Right dominant with no anomalies  LM: No plaque or stenosis.  LAD system: No plaque or stenosis.  Circumflex system: Moderate ramus, no plaque or stenosis. Small AV LCx, no plaque or stenosis.  RCA system: Calcified plaque ostial RCA, mild stenosis (25-49%). No other significant disease.  IMPRESSION: 1. Coronary artery calcium score 34 Agatston units. This places the patient in the 47th percentile for age and gender, suggesting low to intermediate risk for future cardiac events.  2. The only significant stenosis noted is a chunk of calcified plaque at the RCA ostium. This is likely causing only mild stenosis but will send for FFR.  FFR was done, suggesting no hemodynamically significant stenosis. Formal report to follow.  Dalton Mclean   Electronically Signed By: Marca Ancona M.D. On: 08/31/2021 15:01  Narrative EXAM: OVER-READ INTERPRETATION  CT CHEST  The following report is an over-read performed by radiologist Dr. Trudie Reed of [facility] on 08/31/2021. This over-read does not include interpretation of cardiac or coronary anatomy or pathology. The coronary calcium score/coronary CTA interpretation by the cardiologist is attached.  COMPARISON:  None.  FINDINGS: Atherosclerotic calcifications in the thoracic aorta. Within the visualized portions of the thorax there are no suspicious appearing pulmonary nodules or masses, there is no acute consolidative airspace disease, no pleural effusions, no pneumothorax and no lymphadenopathy. Visualized portions of the upper abdomen are unremarkable. There are no  aggressive appearing lytic or blastic lesions noted in the visualized portions of the skeleton.  IMPRESSION: 1.  Aortic Atherosclerosis (ICD10-I70.0).  Electronically Signed: By: Trudie Reed M.D. On: 08/31/2021 10:09         Recent Labs: No results found for requested labs within last 365 days.  Recent Lipid Panel No results found for: "CHOL", "TRIG", "HDL", "CHOLHDL", "VLDL", "LDLCALC", "LDLDIRECT"      Physical Exam:    VS:  BP (!) 155/66   Pulse (!) 53   Ht 5\' 6"  (1.676 m)   Wt 232 lb 12.8 oz (  105.6 kg)   SpO2 96%   BMI 37.57 kg/m     Wt Readings from Last 3 Encounters:  11/30/22 232 lb 12.8 oz (105.6 kg)  06/17/22 232 lb (105.2 kg)  10/14/21 232 lb (105.2 kg)    Gen: no distress, morbid obesity  Neck: No JVD Cardiac: No Rubs or Gallops, no murmur, regular bradycardia +2 radial pulses Respiratory: Clear to auscultation bilaterally, normal effort, normal  respiratory rate GI: Soft, nontender, non-distended  MS: No  edema;  moves all extremities Integument: Skin feels warm Neuro:  At time of evaluation, alert and oriented to person/place/time/situation  Psych: Normal affect, patient feels well   ASSESSMENT:    1. Hypertension associated with diabetes (HCC)   2. Coronary artery disease involving native coronary artery of native heart with unstable angina pectoris (HCC)   3. Morbid obesity (HCC)   4. Other fatigue   5. Aortic atherosclerosis (HCC)     PLAN:    Mild non obstructive coronary artery disease  Atypical CP  HLD Aortic atherosclerosis - ASA 81 mg PO daily - LDL is < 70, continue rosuvastatin - PET MPI  HTN with DM - BP above goal despite PCP recent increase in ACEi - amlodipine 5 mg PO daily - monopril 40  mg PO daily  - Increase to HCTZ  25 mg PO daily - BMP in one week   Fatigue DOE Hot and cold flashes - TSH, CBC, BNP labs next week  Morbid Obesity - discussed exercise options  Fall f/u with team    Medication  Adjustments/Labs and Tests Ordered: Current medicines are reviewed at length with the patient today.  Concerns regarding medicines are outlined above.  Orders Placed This Encounter  Procedures   NM PET CT CARDIAC PERFUSION MULTI W/ABSOLUTE BLOODFLOW   TSH   CBC   Pro b natriuretic peptide (BNP)   Basic metabolic panel   Cardiac Stress Test: Informed Consent Details: Physician/Practitioner Attestation; Transcribe to consent form and obtain patient signature   Meds ordered this encounter  Medications   hydrochlorothiazide (HYDRODIURIL) 25 MG tablet    Sig: Take 1 tablet (25 mg total) by mouth daily.    Dispense:  90 tablet    Refill:  3    Patient Instructions  Medication Instructions:  Your physician has recommended you make the following change in your medication:  1.) increase hctz to 25 mg - take one tablet daily  *If you need a refill on your cardiac medications before your next appointment, please call your pharmacy*   Lab Work: Please return in one week for blood work  (Tsh, cbc, bmet, bnp)  If you have labs (blood work) drawn today and your tests are completely normal, you will receive your results only by: MyChart Message (if you have MyChart) OR A paper copy in the mail If you have any lab test that is abnormal or we need to change your treatment, we will call you to review the results.   Testing/Procedures: Cardiac PET Stress Test - see instructions below   Follow-Up: At [facility], you and your health needs are our priority.  As part of our continuing mission to provide you with exceptional heart care, we have created designated Provider Care Teams.  These Care Teams include your primary Cardiologist (physician) and Advanced Practice Providers (APPs -  Physician Assistants and Nurse Practitioners) who all work together to provide you with the care you need, when you need it.  We recommend signing up  for the patient portal called "MyChart".  Sign up  information is provided on this After Visit Summary.  MyChart is used to connect with patients for Virtual Visits (Telemedicine).  Patients are able to view lab/test results, encounter notes, upcoming appointments, etc.  Non-urgent messages can be sent to your provider as well.   To learn more about what you can do with MyChart, go to [email or web address].    Your next appointment:   3 month(s)  Provider:   Advanced Practice Practitioner (NP or PA-C)  Other Instructions How to Prepare for Your Cardiac PET/CT Stress Test:  1. Please do not take these medications before your test:   Medications that may interfere with the cardiac pharmacological stress agent (ex. nitrates - including erectile dysfunction medications, isosorbide mononitrate, tamulosin or beta-blockers) the day of the exam. (Erectile dysfunction medication should be held for at least 72 hrs prior to test) Theophylline containing medications for 12 hours. Dipyridamole 48 hours prior to the test. Your remaining medications may be taken with water.  2. Nothing to eat or drink, except water, 3 hours prior to arrival time.   NO caffeine/decaffeinated products, or chocolate 12 hours prior to arrival.  3. NO perfume, cologne or lotion  4. Total time is 1 to 2 hours; you may want to bring reading material for the waiting time.  5. Please report to Radiology at the [facility] Main Entrance 30 minutes early for your test.  [address] [place], [place] [address]  Diabetic Preparation:  Hold oral medications. (METFORMIN) Check blood sugars prior to leaving the house. If able to eat breakfast prior to 3 hour fasting, you may take all medications, including your insulin, Do not worry if you miss your breakfast dose of insulin - start at your next meal.  IF YOU THINK YOU MAY BE PREGNANT, OR ARE NURSING PLEASE INFORM THE TECHNOLOGIST.  In preparation for your appointment, medication and supplies will be  purchased.  Appointment availability is limited, so if you need to cancel or reschedule, please call the Radiology Department at [phone]  24 hours in advance to avoid a cancellation fee of $100.00  What to Expect After you Arrive:  Once you arrive and check in for your appointment, you will be taken to a preparation room within the Radiology Department.  A technologist or Nurse will obtain your medical history, verify that you are correctly prepped for the exam, and explain the procedure.  Afterwards,  an IV will be started in your arm and electrodes will be placed on your skin for EKG monitoring during the stress portion of the exam. Then you will be escorted to the PET/CT scanner.  There, staff will get you positioned on the scanner and obtain a blood pressure and EKG.  During the exam, you will continue to be connected to the EKG and blood pressure machines.  A small, safe amount of a radioactive tracer will be injected in your IV to obtain a series of pictures of your heart along with an injection of a stress agent.    After your Exam:  It is recommended that you eat a meal and drink a caffeinated beverage to counter act any effects of the stress agent.  Drink plenty of fluids for the remainder of the day and urinate frequently for the first couple of hours after the exam.  Your doctor will inform you of your test results within 7-10 business days.  For questions about your test or how to  prepare for your test, please call: Rockwell Alexandria, Cardiac Imaging Nurse Navigator  Larey Brick, Cardiac Imaging Nurse Navigator Office: [phone]      Signed, Christell Constant, MD  11/30/2022 5:05 PM    [facility]

## 2022-11-30 NOTE — Patient Instructions (Addendum)
Medication Instructions:  Your physician has recommended you make the following change in your medication:  1.) increase hctz to 25 mg - take one tablet daily  *If you need a refill on your cardiac medications before your next appointment, please call your pharmacy*   Lab Work: Please return in one week for blood work  (Tsh, cbc, bmet, bnp)  If you have labs (blood work) drawn today and your tests are completely normal, you will receive your results only by: MyChart Message (if you have MyChart) OR A paper copy in the mail If you have any lab test that is abnormal or we need to change your treatment, we will call you to review the results.   Testing/Procedures: Cardiac PET Stress Test - see instructions below   Follow-Up: At [facility], you and your health needs are our priority.  As part of our continuing mission to provide you with exceptional heart care, we have created designated Provider Care Teams.  These Care Teams include your primary Cardiologist (physician) and Advanced Practice Providers (APPs -  Physician Assistants and Nurse Practitioners) who all work together to provide you with the care you need, when you need it.  We recommend signing up for the patient portal called "MyChart".  Sign up information is provided on this After Visit Summary.  MyChart is used to connect with patients for Virtual Visits (Telemedicine).  Patients are able to view lab/test results, encounter notes, upcoming appointments, etc.  Non-urgent messages can be sent to your provider as well.   To learn more about what you can do with MyChart, go to [email or web address].    Your next appointment:   3 month(s)  Provider:   Advanced Practice Practitioner (NP or PA-C)  Other Instructions How to Prepare for Your Cardiac PET/CT Stress Test:  1. Please do not take these medications before your test:   Medications that may interfere with the cardiac pharmacological stress agent (ex.  nitrates - including erectile dysfunction medications, isosorbide mononitrate, tamulosin or beta-blockers) the day of the exam. (Erectile dysfunction medication should be held for at least 72 hrs prior to test) Theophylline containing medications for 12 hours. Dipyridamole 48 hours prior to the test. Your remaining medications may be taken with water.  2. Nothing to eat or drink, except water, 3 hours prior to arrival time.   NO caffeine/decaffeinated products, or chocolate 12 hours prior to arrival.  3. NO perfume, cologne or lotion  4. Total time is 1 to 2 hours; you may want to bring reading material for the waiting time.  5. Please report to Radiology at the [facility] Main Entrance 30 minutes early for your test.  [address] [place], [place] [address]  Diabetic Preparation:  Hold oral medications. (METFORMIN) Check blood sugars prior to leaving the house. If able to eat breakfast prior to 3 hour fasting, you may take all medications, including your insulin, Do not worry if you miss your breakfast dose of insulin - start at your next meal.  IF YOU THINK YOU MAY BE PREGNANT, OR ARE NURSING PLEASE INFORM THE TECHNOLOGIST.  In preparation for your appointment, medication and supplies will be purchased.  Appointment availability is limited, so if you need to cancel or reschedule, please call the Radiology Department at [phone]  24 hours in advance to avoid a cancellation fee of $100.00  What to Expect After you Arrive:  Once you arrive and check in for your appointment, you will be taken to  a preparation room within the Radiology Department.  A technologist or Nurse will obtain your medical history, verify that you are correctly prepped for the exam, and explain the procedure.  Afterwards,  an IV will be started in your arm and electrodes will be placed on your skin for EKG monitoring during the stress portion of the exam. Then you will be escorted to the PET/CT  scanner.  There, staff will get you positioned on the scanner and obtain a blood pressure and EKG.  During the exam, you will continue to be connected to the EKG and blood pressure machines.  A small, safe amount of a radioactive tracer will be injected in your IV to obtain a series of pictures of your heart along with an injection of a stress agent.    After your Exam:  It is recommended that you eat a meal and drink a caffeinated beverage to counter act any effects of the stress agent.  Drink plenty of fluids for the remainder of the day and urinate frequently for the first couple of hours after the exam.  Your doctor will inform you of your test results within 7-10 business days.  For questions about your test or how to prepare for your test, please call: Rockwell Alexandria, Cardiac Imaging Nurse Navigator  Larey Brick, Cardiac Imaging Nurse Navigator Office: [phone]

## 2022-12-08 ENCOUNTER — Ambulatory Visit: Payer: Medicare HMO | Attending: Internal Medicine

## 2022-12-08 DIAGNOSIS — I25110 Atherosclerotic heart disease of native coronary artery with unstable angina pectoris: Secondary | ICD-10-CM

## 2022-12-08 DIAGNOSIS — R5383 Other fatigue: Secondary | ICD-10-CM

## 2022-12-08 DIAGNOSIS — I700 Atherosclerosis of aorta: Secondary | ICD-10-CM

## 2022-12-08 DIAGNOSIS — I152 Hypertension secondary to endocrine disorders: Secondary | ICD-10-CM

## 2022-12-09 LAB — BASIC METABOLIC PANEL
BUN/Creatinine Ratio: 13 (ref 12–28)
BUN: 10 mg/dL (ref 8–27)
CO2: 26 mmol/L (ref 20–29)
Calcium: 10.1 mg/dL (ref 8.7–10.3)
Chloride: 99 mmol/L (ref 96–106)
Creatinine, Ser: 0.78 mg/dL (ref 0.57–1.00)
Glucose: 95 mg/dL (ref 70–99)
Potassium: 4.5 mmol/L (ref 3.5–5.2)
Sodium: 140 mmol/L (ref 134–144)
eGFR: 79 mL/min/{1.73_m2} (ref >59)

## 2022-12-09 LAB — CBC
Hematocrit: 38.7 % (ref 34.0–46.6)
Hemoglobin: 13 g/dL (ref 11.1–15.9)
MCH: 30.4 pg (ref 26.6–33.0)
MCHC: 33.6 g/dL (ref 31.5–35.7)
MCV: 91 fL (ref 79–97)
Platelets: 260 10*3/uL (ref 150–450)
RBC: 4.27 x10E6/uL (ref 3.77–5.28)
RDW: 11.8 % (ref 11.7–15.4)
WBC: 4.9 10*3/uL (ref 3.4–10.8)

## 2022-12-09 LAB — TSH: TSH: 0.612 u[IU]/mL (ref 0.450–4.500)

## 2022-12-09 LAB — PRO B NATRIURETIC PEPTIDE: NT-Pro BNP: 41 pg/mL (ref 0–738)

## 2023-02-10 ENCOUNTER — Encounter (HOSPITAL_COMMUNITY): Payer: Self-pay

## 2023-02-14 ENCOUNTER — Encounter (HOSPITAL_COMMUNITY)
Admission: RE | Admit: 2023-02-14 | Discharge: 2023-02-14 | Disposition: A | Discharge status: Home / Self Care | Payer: Medicare HMO | Source: Ambulatory Visit | Attending: Internal Medicine | Admitting: Internal Medicine

## 2023-02-14 ENCOUNTER — Encounter (HOSPITAL_COMMUNITY): Payer: Self-pay

## 2023-02-14 DIAGNOSIS — E1159 Type 2 diabetes mellitus with other circulatory complications: Secondary | ICD-10-CM | POA: Diagnosis not present

## 2023-02-14 DIAGNOSIS — I25110 Atherosclerotic heart disease of native coronary artery with unstable angina pectoris: Secondary | ICD-10-CM | POA: Insufficient documentation

## 2023-02-14 DIAGNOSIS — R5383 Other fatigue: Secondary | ICD-10-CM | POA: Diagnosis present

## 2023-02-14 DIAGNOSIS — I152 Hypertension secondary to endocrine disorders: Secondary | ICD-10-CM | POA: Diagnosis not present

## 2023-02-14 DIAGNOSIS — I700 Atherosclerosis of aorta: Secondary | ICD-10-CM | POA: Diagnosis not present

## 2023-02-14 LAB — NM PET CT CARDIAC PERFUSION MULTI W/ABSOLUTE BLOODFLOW
MBFR: 2.97
Nuc Rest EF: 56 %
Nuc Stress EF: 62 %
Rest MBF: 0.61 ml/g/min
Rest Nuclear Isotope Dose: 27.3 mCi
ST Depression (mm): 0 mm
Stress MBF: 1.81 ml/g/min
Stress Nuclear Isotope Dose: 27.3 mCi
TID: 1.11

## 2023-02-14 MED ORDER — REGADENOSON 0.4 MG/5ML IV SOLN
INTRAVENOUS | Status: AC
  Filled 2023-02-14: qty 5

## 2023-02-14 MED ORDER — RUBIDIUM RB82 GENERATOR (RUBYFILL)
25.0000 | PACK | Freq: Once | INTRAVENOUS | Status: AC
  Administered 2023-02-14: 27.32 via INTRAVENOUS

## 2023-02-14 MED ORDER — RUBIDIUM RB82 GENERATOR (RUBYFILL)
25.0000 | PACK | Freq: Once | INTRAVENOUS | Status: AC
  Administered 2023-02-14: 27.3 via INTRAVENOUS

## 2023-02-14 MED ORDER — REGADENOSON 0.4 MG/5ML IV SOLN
0.4000 mg | Freq: Once | INTRAVENOUS | Status: AC
  Administered 2023-02-14: 0.4 mg via INTRAVENOUS

## 2023-03-13 NOTE — Progress Notes (Unsigned)
  Cardiology Office Note    Patient Name: Kylie Brown Date of Encounter: 03/13/2023  Primary Care Provider:  Sigmund Hazel, MD Primary Cardiologist:  Christell Constant, MD Primary Electrophysiologist: None   Past Medical History    Past Medical History:  Diagnosis Date   Arthritis    Bradycardia    Diabetes (HCC)    Hypertension    Sleep apnea     History of Present Illness  Kylie Brown is a 77 y.o. female with a PMH of***   During today's visit the patient reports*** .  Patient denies chest pain, palpitations, dyspnea, PND, orthopnea, nausea, vomiting, dizziness, syncope, edema, weight gain, or early satiety.  ***Notes: -Last ischemic evaluation: -Last echo: -Interim ED visits: Review of Systems  Please see the history of present illness.    All other systems reviewed and are otherwise negative except as noted above.  Physical Exam    Wt Readings from Last 3 Encounters:  11/30/22 232 lb 12.8 oz (105.6 kg)  06/17/22 232 lb (105.2 kg)  10/14/21 232 lb (105.2 kg)   ZO:[id number] were no vitals filed for this visit.,There is no height or weight on file to calculate BMI. GEN: Well nourished, well developed in no acute distress Neck: No JVD; No carotid bruits Pulmonary: Clear to auscultation without rales, wheezing or rhonchi  Cardiovascular: Normal rate. Regular rhythm. Normal S1. Normal S2.   Murmurs: There is no murmur.  ABDOMEN: Soft, non-tender, non-distended EXTREMITIES:  No edema; No deformity   EKG/LABS/ Recent Cardiac Studies   ECG personally reviewed by me today - ***  Risk Assessment/Calculations:   {Does this patient have ATRIAL FIBRILLATION?[phone]}      Lab Results  Component Value Date   WBC 4.9 12/08/2022   HGB 13.0 12/08/2022   HCT 38.7 12/08/2022   MCV 91 12/08/2022   PLT 260 12/08/2022   Lab Results  Component Value Date   CREATININE 0.78 12/08/2022   BUN 10 12/08/2022   NA 140 12/08/2022   K  4.5 12/08/2022   CL 99 12/08/2022   CO2 26 12/08/2022   No results found for: "CHOL", "HDL", "LDLCALC", "LDLDIRECT", "TRIG", "CHOLHDL"  No results found for: "HGBA1C" Assessment & Plan    1.***  2.***  3.***  4.***      Disposition: Follow-up with Christell Constant, MD or APP in *** months {Are you ordering a CV Procedure (e.g. stress test, cath, DCCV, TEE, etc)?   Press F2        :[id number]}   Signed, Napoleon Form, Leodis Rains, NP 03/13/2023, 2:19 PM [facility]

## 2023-03-15 ENCOUNTER — Ambulatory Visit: Payer: Medicare HMO | Attending: Nurse Practitioner | Admitting: Nurse Practitioner

## 2023-03-15 ENCOUNTER — Encounter: Payer: Self-pay | Admitting: Nurse Practitioner

## 2023-03-15 VITALS — BP 128/58 | HR 57 | Ht 66.0 in | Wt 242.2 lb

## 2023-03-15 DIAGNOSIS — E1159 Type 2 diabetes mellitus with other circulatory complications: Secondary | ICD-10-CM | POA: Diagnosis not present

## 2023-03-15 DIAGNOSIS — I152 Hypertension secondary to endocrine disorders: Secondary | ICD-10-CM

## 2023-03-15 DIAGNOSIS — R079 Chest pain, unspecified: Secondary | ICD-10-CM | POA: Diagnosis not present

## 2023-03-15 DIAGNOSIS — I2510 Atherosclerotic heart disease of native coronary artery without angina pectoris: Secondary | ICD-10-CM

## 2023-03-15 DIAGNOSIS — I700 Atherosclerosis of aorta: Secondary | ICD-10-CM

## 2023-03-15 NOTE — Patient Instructions (Addendum)
Medication Instructions:  Your physician recommends that you continue on your current medications as directed. Please refer to the Current Medication list given to you today.  *If you need a refill on your cardiac medications before your next appointment, please call your pharmacy*   Lab Work: BMET IN 1 MONTH  If you have labs (blood work) drawn today and your tests are completely normal, you will receive your results only by: MyChart Message (if you have MyChart) OR A paper copy in the mail If you have any lab test that is abnormal or we need to change your treatment, we will call you to review the results.   Testing/Procedures: None   Follow-Up: At [facility], you and your health needs are our priority.  As part of our continuing mission to provide you with exceptional heart care, we have created designated Provider Care Teams.  These Care Teams include your primary Cardiologist (physician) and Advanced Practice Providers (APPs -  Physician Assistants and Nurse Practitioners) who all work together to provide you with the care you need, when you need it.  We recommend signing up for the patient portal called "MyChart".  Sign up information is provided on this After Visit Summary.  MyChart is used to connect with patients for Virtual Visits (Telemedicine).  Patients are able to view lab/test results, encounter notes, upcoming appointments, etc.  Non-urgent messages can be sent to your provider as well.   To learn more about what you can do with MyChart, go to [email or web address].    Your next appointment:   6 month(s)  Provider:   Christell Constant, MD     Other Instructions PLEASE WITH DR. MILLER ABOUT RESTLESS LEG SYNDROME.  64 OUNCES OF WATER AND DRINK SPORTS DRINKS.  Heart-Healthy Eating Plan Many factors influence your heart health, including eating and exercise habits. Heart health is also called coronary health. Coronary risk increases with abnormal  blood fat (lipid) levels. A heart-healthy eating plan includes limiting unhealthy fats, increasing healthy fats, limiting salt (sodium) intake, and making other diet and lifestyle changes. What is my plan? Your health care provider may recommend that: You limit your fat intake to _________% or less of your total calories each day. You limit your saturated fat intake to _________% or less of your total calories each day. You limit the amount of cholesterol in your diet to less than _________ mg per day. You limit the amount of sodium in your diet to less than _________ mg per day. What are tips for following this plan? Cooking Cook foods using methods other than frying. Baking, boiling, grilling, and broiling are all good options. Other ways to reduce fat include: Removing the skin from poultry. Removing all visible fats from meats. Steaming vegetables in water or broth. Meal planning  At meals, imagine dividing your plate into fourths: Fill one-half of your plate with vegetables and green salads. Fill one-fourth of your plate with whole grains. Fill one-fourth of your plate with lean protein foods. Eat 2-4 cups of vegetables per day. One cup of vegetables equals 1 cup (91 g) broccoli or cauliflower florets, 2 medium carrots, 1 large bell pepper, 1 large sweet potato, 1 large tomato, 1 medium white potato, 2 cups (150 g) raw leafy greens. Eat 1-2 cups of fruit per day. One cup of fruit equals 1 small apple, 1 large banana, 1 cup (237 g) mixed fruit, 1 large orange,  cup (82 g) dried fruit, 1 cup (240 mL) 100% fruit juice.  Eat more foods that contain soluble fiber. Examples include apples, broccoli, carrots, beans, peas, and barley. Aim to get 25-30 g of fiber per day. Increase your consumption of legumes, nuts, and seeds to 4-5 servings per week. One serving of dried beans or legumes equals  cup (90 g) cooked, 1 serving of nuts is  oz (12 almonds, 24 pistachios, or 7 walnut halves), and 1  serving of seeds equals  oz (8 g). Fats Choose healthy fats more often. Choose monounsaturated and polyunsaturated fats, such as olive and canola oils, avocado oil, flaxseeds, walnuts, almonds, and seeds. Eat more omega-3 fats. Choose salmon, mackerel, sardines, tuna, flaxseed oil, and ground flaxseeds. Aim to eat fish at least 2 times each week. Check food labels carefully to identify foods with trans fats or high amounts of saturated fat. Limit saturated fats. These are found in animal products, such as meats, butter, and cream. Plant sources of saturated fats include palm oil, palm kernel oil, and coconut oil. Avoid foods with partially hydrogenated oils in them. These contain trans fats. Examples are stick margarine, some tub margarines, cookies, crackers, and other baked goods. Avoid fried foods. General information Eat more home-cooked food and less restaurant, buffet, and fast food. Limit or avoid alcohol. Limit foods that are high in added sugar and simple starches such as foods made using white refined flour (white breads, pastries, sweets). Lose weight if you are overweight. Losing just 5-10% of your body weight can help your overall health and prevent diseases such as diabetes and heart disease. Monitor your sodium intake, especially if you have high blood pressure. Talk with your health care provider about your sodium intake. Try to incorporate more vegetarian meals weekly. What foods should I eat? Fruits All fresh, canned (in natural juice), or frozen fruits. Vegetables Fresh or frozen vegetables (raw, steamed, roasted, or grilled). Green salads. Grains Most grains. Choose whole wheat and whole grains most of the time. Rice and pasta, including Aikens rice and pastas made with whole wheat. Meats and other proteins Lean, well-trimmed beef, veal, pork, and lamb. Chicken and [place] without skin. All fish and shellfish. Wild duck, rabbit, pheasant, and venison. Egg whites or  low-cholesterol egg substitutes. Dried beans, peas, lentils, and tofu. Seeds and most nuts. Dairy Low-fat or nonfat cheeses, including ricotta and mozzarella. Skim or 1% milk (liquid, powdered, or evaporated). Buttermilk made with low-fat milk. Nonfat or low-fat yogurt. Fats and oils Non-hydrogenated (trans-free) margarines. Vegetable oils, including soybean, sesame, sunflower, olive, avocado, peanut, safflower, corn, canola, and cottonseed. Salad dressings or mayonnaise made with a vegetable oil. Beverages Water (mineral or sparkling). Coffee and tea. Unsweetened ice tea. Diet beverages. Sweets and desserts Sherbet, gelatin, and fruit ice. Small amounts of dark chocolate. Limit all sweets and desserts. Seasonings and condiments All seasonings and condiments. The items listed above may not be a complete list of foods and beverages you can eat. Contact a dietitian for more options. What foods should I avoid? Fruits Canned fruit in heavy syrup. Fruit in cream or butter sauce. Fried fruit. Limit coconut. Vegetables Vegetables cooked in cheese, cream, or butter sauce. Fried vegetables. Grains Breads made with saturated or trans fats, oils, or whole milk. Croissants. Sweet rolls. Donuts. High-fat crackers, such as cheese crackers and chips. Meats and other proteins Fatty meats, such as hot dogs, ribs, sausage, bacon, rib-eye roast or steak. High-fat deli meats, such as salami and bologna. Caviar. Domestic duck and goose. Organ meats, such as liver. Dairy Cream, sour cream, cream  cheese, and creamed cottage cheese. Whole-milk cheeses. Whole or 2% milk (liquid, evaporated, or condensed). Whole buttermilk. Cream sauce or high-fat cheese sauce. Whole-milk yogurt. Fats and oils Meat fat, or shortening. Cocoa butter, hydrogenated oils, palm oil, coconut oil, palm kernel oil. Solid fats and shortenings, including bacon fat, salt pork, lard, and butter. Nondairy cream substitutes. Salad dressings with  cheese or sour cream. Beverages Regular sodas and any drinks with added sugar. Sweets and desserts Frosting. Pudding. Cookies. Cakes. Pies. Milk chocolate or white chocolate. Buttered syrups. Full-fat ice cream or ice cream drinks. The items listed above may not be a complete list of foods and beverages to avoid. Contact a dietitian for more information. Summary Heart-healthy meal planning includes limiting unhealthy fats, increasing healthy fats, limiting salt (sodium) intake and making other diet and lifestyle changes. Lose weight if you are overweight. Losing just 5-10% of your body weight can help your overall health and prevent diseases such as diabetes and heart disease. Focus on eating a balance of foods, including fruits and vegetables, low-fat or nonfat dairy, lean protein, nuts and legumes, whole grains, and heart-healthy oils and fats. This information is not intended to replace advice given to you by your health care provider. Make sure you discuss any questions you have with your health care provider. Document Revised: 07/19/2021 Document Reviewed: 07/19/2021 Elsevier Patient Education  2024 [employer].

## 2023-04-13 ENCOUNTER — Ambulatory Visit: Payer: Medicare HMO | Attending: Nurse Practitioner

## 2023-04-14 LAB — BASIC METABOLIC PANEL
BUN/Creatinine Ratio: 18 (ref 12–28)
BUN: 11 mg/dL (ref 8–27)
CO2: 23 mmol/L (ref 20–29)
Calcium: 9.3 mg/dL (ref 8.7–10.3)
Chloride: 107 mmol/L — ABNORMAL HIGH (ref 96–106)
Creatinine, Ser: 0.61 mg/dL (ref 0.57–1.00)
Glucose: 89 mg/dL (ref 70–99)
Potassium: 4.1 mmol/L (ref 3.5–5.2)
Sodium: 146 mmol/L — ABNORMAL HIGH (ref 134–144)
eGFR: 93 mL/min/{1.73_m2} (ref >59)

## 2023-08-29 ENCOUNTER — Encounter: Payer: Self-pay | Admitting: Internal Medicine

## 2023-08-29 ENCOUNTER — Ambulatory Visit: Payer: Medicare HMO | Attending: Internal Medicine | Admitting: Internal Medicine

## 2023-08-29 VITALS — BP 130/60 | HR 58 | Ht 66.0 in | Wt 241.6 lb

## 2023-08-29 DIAGNOSIS — I700 Atherosclerosis of aorta: Secondary | ICD-10-CM

## 2023-08-29 DIAGNOSIS — R079 Chest pain, unspecified: Secondary | ICD-10-CM

## 2023-08-29 DIAGNOSIS — R011 Cardiac murmur, unspecified: Secondary | ICD-10-CM

## 2023-08-29 DIAGNOSIS — I2510 Atherosclerotic heart disease of native coronary artery without angina pectoris: Secondary | ICD-10-CM | POA: Diagnosis not present

## 2023-08-29 DIAGNOSIS — I152 Hypertension secondary to endocrine disorders: Secondary | ICD-10-CM

## 2023-08-29 DIAGNOSIS — E1159 Type 2 diabetes mellitus with other circulatory complications: Secondary | ICD-10-CM

## 2023-08-29 NOTE — Patient Instructions (Signed)
 Medication Instructions:  Your physician recommends that you continue on your current medications as directed. Please refer to the Current Medication list given to you today.  *If you need a refill on your cardiac medications before your next appointment, please call your pharmacy*   Lab Work: NONE If you have labs (blood work) drawn today and your tests are completely normal, you will receive your results only by: MyChart Message (if you have MyChart) OR A paper copy in the mail If you have any lab test that is abnormal or we need to change your treatment, we will call you to review the results.   Testing/Procedures: Your physician has requested that you have an echocardiogram. Echocardiography is a painless test that uses sound waves to create images of your heart. It provides your doctor with information about the size and shape of your heart and how well your heart's chambers and valves are working. This procedure takes approximately one hour. There are no restrictions for this procedure. Please do NOT wear cologne, perfume, aftershave, or lotions (deodorant is allowed). Please arrive 15 minutes prior to your appointment time.  Please note: We ask at that you not bring children with you during ultrasound (echo/ vascular) testing. Due to room size and safety concerns, children are not allowed in the ultrasound rooms during exams. Our front office staff cannot provide observation of children in our lobby area while testing is being conducted. An adult accompanying a patient to their appointment will only be allowed in the ultrasound room at the discretion of the ultrasound technician under special circumstances. We apologize for any inconvenience.    Follow-Up: At [facility], you and your health needs are our priority.  As part of our continuing mission to provide you with exceptional heart care, we have created designated Provider Care Teams.  These Care Teams include your  primary Cardiologist (physician) and Advanced Practice Providers (APPs -  Physician Assistants and Nurse Practitioners) who all work together to provide you with the care you need, when you need it.   Your next appointment:   1 year(s)  Provider:   Robin Searing, NP

## 2023-08-29 NOTE — Progress Notes (Signed)
 Cardiology Office Note:  .    Date:  08/29/2023  ID:  Kylie Brown, DOB 1945/07/04, MRN [id number] PCP: Sigmund Hazel, MD  [facility] Providers Cardiologist:  Christell Constant, MD     CC: CAD follow up  History of Present Illness: .    Kylie Brown is a 78 y.o. female with hypertension, diabetes, and coronary artery disease who presents with atypical chest pain. She lives with her daughter. She was referred by Dr. Alden Server for evaluation of atypical chest pain.  She currently denies any chest pain, pressure, or breathing issues. She has a history of sinus bradycardia without evidence of chronotropic incompetence and mild non-obstructive coronary artery disease. Her blood pressure is controlled with amlodipine 5 mg, fosinopril, and hydrochlorothiazide. She is also on aspirin for coronary artery disease and rosuvastatin 5 mg, with her LDL under 70.  She experiences fatigue and shortness of breath upon exertion. In 2024, a stress test showed normal flow in her microvasculature. An echocardiogram did not show evidence of left ventricular hypertrophy despite previous EKG findings suggesting heart thickening.  She has significant back pain due to ruptured discs in the L4, L5 area, ongoing for years. She underwent back surgery in 2005, which did not resolve her issues. An MRI in [place] revealed the extent of her back condition. She is scheduled for a procedure involving needle injections to ease nerve pain on April 3rd. In February, her back pain was severe, radiating down her leg, initially leading her to believe she might need a hip replacement, but it was determined to be nerve-related.  Relevant histories: .  Social - Husband passed from CHF, sister comes to visit sometimes, has a daughter 2023: Sx improved. Exercise prescription attempted  2024: PET negative.   ROS: As per HPI.   Studies Reviewed: .   Cardiac Studies & Procedures    ______________________________________________________________________________________________   STRESS TESTS  NM PET CT CARDIAC PERFUSION MULTI W/ABSOLUTE BLOODFLOW 02/14/2023  Narrative   The study is normal. The study is low risk.   LV perfusion is normal. There is no evidence of ischemia. There is no evidence of infarction.   Rest left ventricular function is normal. Rest EF: 56%. Stress left ventricular function is normal. Stress EF: 62%. End diastolic cavity size is normal. End systolic cavity size is normal.   Myocardial blood flow was computed to be 0.61ml/g/min at rest and 1.1ml/g/min at stress. Global myocardial blood flow reserve was 2.97 and was normal.   Coronary calcium was present on the attenuation correction CT images. Mild coronary calcifications were present. Coronary calcifications were present in the right coronary artery distribution(s).   Electronically signed by Epifanio Lesches, MD  CLINICAL DATA:  This over-read does not include interpretation of cardiac or coronary anatomy or pathology. The cardiac PET-CT interpretation by the cardiologist is attached.  COMPARISON:  Cardiac CT examination 08/31/2021.  FINDINGS: Atherosclerotic calcifications in the thoracic aorta. Within the visualized portions of the thorax there are no suspicious appearing pulmonary nodules or masses, there is no acute consolidative airspace disease, no pleural effusions, no pneumothorax and no lymphadenopathy. Visualized portions of the upper abdomen are unremarkable. There are no aggressive appearing lytic or blastic lesions noted in the visualized portions of the skeleton.  IMPRESSION: 1.  Aortic Atherosclerosis (ICD10-I70.0).   Electronically Signed By: Trudie Reed M.D. On: 02/14/2023 09:37   ECHOCARDIOGRAM  ECHOCARDIOGRAM COMPLETE 08/17/2021  Narrative ECHOCARDIOGRAM REPORT    Patient Name:   Kylie Brown  Kylie Brown Date of Exam: 08/17/2021 Medical Rec  #:  [id number]                      Height:       66.0 in Accession #:    [id number]                     Weight:       241.0 lb Date of Birth:  Sep 22, 1945                      BSA:          2.165 m Patient Age:    75 years                       BP:           138/60 mmHg Patient Gender: F                              HR:           52 bpm. Exam Location:  [address]  Procedure: 2D Echo, Cardiac Doppler and Color Doppler  Indications:    r07.9 Chest pain R06.02 Shortness of breath  History:        Patient has no prior history of Echocardiogram examinations. Arrythmias:Bradycardia; Risk Factors:Hypertension, Diabetes, Obesity and Sleep Apnea.  Sonographer:    Daphine Deutscher RDCS Referring Phys: [id number] [facility] [place]  IMPRESSIONS   1. Left ventricular ejection fraction, by estimation, is 60 to 65%. The left ventricle has normal function. The left ventricle has no regional wall motion abnormalities. Left ventricular diastolic parameters were normal. 2. Right ventricular systolic function is normal. The right ventricular size is normal. 3. Left atrial size was moderately dilated. 4. The mitral valve is normal in structure. Trivial mitral valve regurgitation. No evidence of mitral stenosis. 5. The aortic valve is tricuspid. There is mild calcification of the aortic valve. There is mild thickening of the aortic valve. Aortic valve regurgitation is not visualized. Aortic valve sclerosis is present, with no evidence of aortic valve stenosis. 6. The inferior vena cava is normal in size with greater than 50% respiratory variability, suggesting right atrial pressure of 3 mmHg.  FINDINGS Left Ventricle: Left ventricular ejection fraction, by estimation, is 60 to 65%. The left ventricle has normal function. The left ventricle has no regional wall motion abnormalities. The left ventricular internal cavity size was normal in size. There is no left ventricular hypertrophy. Left  ventricular diastolic parameters were normal.  Right Ventricle: The right ventricular size is normal. No increase in right ventricular wall thickness. Right ventricular systolic function is normal.  Left Atrium: Left atrial size was moderately dilated.  Right Atrium: Right atrial size was normal in size.  Pericardium: There is no evidence of pericardial effusion.  Mitral Valve: The mitral valve is normal in structure. There is mild thickening of the mitral valve leaflet(s). Trivial mitral valve regurgitation. No evidence of mitral valve stenosis.  Tricuspid Valve: The tricuspid valve is normal in structure. Tricuspid valve regurgitation is trivial. No evidence of tricuspid stenosis.  Aortic Valve: The aortic valve is tricuspid. There is mild calcification of the aortic valve. There is mild thickening of the aortic valve. Aortic valve regurgitation is not visualized. Aortic valve sclerosis is present, with no evidence of aortic valve stenosis.  Pulmonic Valve: The pulmonic  valve was normal in structure. Pulmonic valve regurgitation is not visualized. No evidence of pulmonic stenosis.  Aorta: The aortic root is normal in size and structure.  Venous: The inferior vena cava is normal in size with greater than 50% respiratory variability, suggesting right atrial pressure of 3 mmHg.  IAS/Shunts: No atrial level shunt detected by color flow Doppler.   LEFT VENTRICLE PLAX 2D LVIDd:         5.00 cm   Diastology LVIDs:         3.30 cm   LV e' medial:    6.96 cm/s LV PW:         0.90 cm   LV E/e' medial:  16.5 LV IVS:        0.60 cm   LV e' lateral:   7.40 cm/s LVOT diam:     1.90 cm   LV E/e' lateral: 15.5 LV SV:         78 LV SV Index:   36 LVOT Area:     2.84 cm   RIGHT VENTRICLE             IVC RV Basal diam:  3.90 cm     IVC diam: 2.30 cm RV S prime:     12.90 cm/s TAPSE (M-mode): 2.7 cm  LEFT ATRIUM             Index        RIGHT ATRIUM           Index LA diam:        5.00 cm  2.31 cm/m   RA Area:     14.50 cm LA Vol (A2C):   68.7 ml 31.74 ml/m  RA Volume:   37.90 ml  17.51 ml/m LA Vol (A4C):   58.3 ml 26.93 ml/m LA Biplane Vol: 63.2 ml 29.20 ml/m AORTIC VALVE LVOT Vmax:   103.00 cm/s LVOT Vmean:  66.900 cm/s LVOT VTI:    0.274 m  AORTA Ao Root diam: 3.00 cm  MITRAL VALVE                TRICUSPID VALVE MV Area (PHT): 3.95 cm     TR Peak grad:   38.9 mmHg MV Decel Time: 192 msec     TR Vmax:        312.00 cm/s MV E velocity: 114.50 cm/s MV A velocity: 104.00 cm/s  SHUNTS MV E/A ratio:  1.10         Systemic VTI:  0.27 m Systemic Diam: 1.90 cm  Charlton Haws MD Electronically signed by Charlton Haws MD Signature Date/Time: 08/17/2021/10:22:19 AM    Final    MONITORS  LONG TERM MONITOR (3-14 DAYS) 08/17/2021  Narrative  Patient had a minimum heart rate of 38 bpm (nocturnal), maximum heart rate of 128 bpm (SVT), and average heart rate of 58 bpm.  Predominant underlying rhythm was sinus bradycardia.  Two runs of SVT occurred lasting 20 beats at longest with a max rate of 128 bpm at fastest.  Isolated PACs were rare (<1.0%).  Isolated PVCs were rare (<1.0%).  Triggered and diary events associated with sinus bradycardia, sinus rhythm, PACs, and PVCs.  No malignant arrhythmias.   CT SCANS  CT CORONARY FRACTIONAL FLOW RESERVE DATA PREP 08/31/2021  Narrative EXAM: CT FFR ANALYSIS  FINDINGS: CT FFR analysis was performed on the original cardiac computed tomography angiogram dataset. Diagrammatic representation of the CT FFR analysis is provided in a separate PDF document in PACS. This  dictation was created using the PDF document and an interactive 3D model of the results. The 3D model is not available in the EMR/PACS. Normal CT FFR range is >0.80.  1. Left Main: No significant stenosis.  2. LAD: No significant stenosis. 3. LCX: No significant stenosis. 4. RCA: FFR 0.96 proximal RCA.  IMPRESSION: 1.  CT FFR analysis didn't show  any significant stenosis.  Marca Ancona, MD   Electronically Signed By: Marca Ancona M.D. On: 09/02/2021 17:49   CT CORONARY MORPH W/CTA COR W/SCORE 08/31/2021  Addendum 08/31/2021  3:03 PM ADDENDUM REPORT: 08/31/2021 15:01  CLINICAL DATA:  Chest pain  EXAM: Cardiac CTA  MEDICATIONS: Sub lingual nitro. 4mg  x 2  TECHNIQUE: The patient was scanned on a Siemens 192 slice scanner. Gantry rotation speed was 250 msecs. Collimation was 0.6 mm. A 100 kV prospective scan was triggered in the ascending thoracic aorta at 35-75% of the R-R interval. Average HR during the scan was 60 bpm. The 3D data set was interpreted on a dedicated work station using MPR, MIP and VRT modes. A total of 80cc of contrast was used.  FINDINGS: Non-cardiac: See separate report from [facility].  No LA appendage thrombus. Pulmonary veins drain normally to the left atrium.  Calcium Score: 34 Agatston units.  Coronary Arteries: Right dominant with no anomalies  LM: No plaque or stenosis.  LAD system: No plaque or stenosis.  Circumflex system: Moderate ramus, no plaque or stenosis. Small AV LCx, no plaque or stenosis.  RCA system: Calcified plaque ostial RCA, mild stenosis (25-49%). No other significant disease.  IMPRESSION: 1. Coronary artery calcium score 34 Agatston units. This places the patient in the 47th percentile for age and gender, suggesting low to intermediate risk for future cardiac events.  2. The only significant stenosis noted is a chunk of calcified plaque at the RCA ostium. This is likely causing only mild stenosis but will send for FFR.  FFR was done, suggesting no hemodynamically significant stenosis. Formal report to follow.  Dalton Mclean   Electronically Signed By: Marca Ancona M.D. On: 08/31/2021 15:01  Narrative EXAM: OVER-READ INTERPRETATION  CT CHEST  The following report is an over-read performed by radiologist Dr. Trudie Reed of  [facility] on 08/31/2021. This over-read does not include interpretation of cardiac or coronary anatomy or pathology. The coronary calcium score/coronary CTA interpretation by the cardiologist is attached.  COMPARISON:  None.  FINDINGS: Atherosclerotic calcifications in the thoracic aorta. Within the visualized portions of the thorax there are no suspicious appearing pulmonary nodules or masses, there is no acute consolidative airspace disease, no pleural effusions, no pneumothorax and no lymphadenopathy. Visualized portions of the upper abdomen are unremarkable. There are no aggressive appearing lytic or blastic lesions noted in the visualized portions of the skeleton.  IMPRESSION: 1.  Aortic Atherosclerosis (ICD10-I70.0).  Electronically Signed: By: Trudie Reed M.D. On: 08/31/2021 10:09     ______________________________________________________________________________________________      An EKG was ordered for CAD and shows LVH  Physical Exam:    VS:  BP 130/60 (BP Location: Left Arm)   Pulse (!) 58   Ht 5\' 6"  (1.676 m)   Wt 241 lb 9.6 oz (109.6 kg)   SpO2 97%   BMI 39.00 kg/m    Wt Readings from Last 3 Encounters:  08/29/23 241 lb 9.6 oz (109.6 kg)  03/15/23 242 lb 3.2 oz (109.9 kg)  11/30/22 232 lb 12.8 oz (105.6 kg)    Gen: no distress,  Morbid obesity   Neck: No JVD Cardiac: No Rubs or Gallops, no Murmur, regular bradycardia, +2 radial pulses Respiratory: Clear to auscultation bilaterally, normal effort, normal  respiratory rate GI: Soft, nontender, non-distended  MS: No  edema;  moves all extremities Integument: Skin feels warm Neuro:  At time of evaluation, alert and oriented to person/place/time/situation  Psych: Normal affect, patient feels ok   ASSESSMENT AND PLAN: .    Atypical Chest Pain She presents with atypical chest pain. Previous stress test and echocardiogram showed no significant coronary artery disease or left ventricular  hypertrophy. Current symptoms include shortness of breath and a heart murmur, necessitating further evaluation.  Heart Murmur A new heart murmur was detected. Its significance is unclear, requiring further evaluation to exclude underlying cardiac issues. It does not indicate an immediate risk of acute cardiac events. - Repeat echocardiogram to assess the heart murmur  Mild Non-Obstructive Coronary Artery Disease She has mild non-obstructive coronary artery disease, asymptomatic, managed with secondary prevention. LDL cholesterol is controlled under 70 mg/dL. - Continue aspirin for coronary artery disease - Continue rosuvastatin 5 mg for cholesterol management  Atherosclerosis - LDL at goal  Sinus Bradycardia Sinus bradycardia is present without chronotropic incompetence,   Hypertension Hypertension is well controlled with current medications. - Continue amlodipine 5 mg, fosinopril, and hydrochlorothiazide for blood pressure management  Obstructive Sleep Apnea - continue current therapy  Back Pain Chronic back pain due to ruptured discs in the L4, L5 area. Scheduled for needle intervention to alleviate nerve pain.  No barriers to this from a cardiac stand point.  Follow-up Follow-up is required to monitor cardiac status and ensure stability of conditions. - Schedule follow-up appointment with cardiologist in one year  Riley Lam, MD FASE [facility] Cardiologist [facility]  [address] [place], [address] [place], [place] [address] [phone]  3:57 PM

## 2023-09-13 ENCOUNTER — Other Ambulatory Visit: Payer: Self-pay | Admitting: Family Medicine

## 2023-09-13 DIAGNOSIS — Z1231 Encounter for screening mammogram for malignant neoplasm of breast: Secondary | ICD-10-CM

## 2023-09-25 ENCOUNTER — Ambulatory Visit (HOSPITAL_COMMUNITY): Attending: Cardiology

## 2023-09-25 ENCOUNTER — Encounter: Payer: Self-pay | Admitting: Internal Medicine

## 2023-09-25 DIAGNOSIS — R011 Cardiac murmur, unspecified: Secondary | ICD-10-CM | POA: Insufficient documentation

## 2023-09-25 LAB — ECHOCARDIOGRAM COMPLETE
Area-P 1/2: 3.95 cm2
S' Lateral: 2.9 cm

## 2023-10-13 ENCOUNTER — Ambulatory Visit
Admission: RE | Admit: 2023-10-13 | Discharge: 2023-10-13 | Disposition: A | Discharge status: Home / Self Care | Source: Ambulatory Visit | Attending: Family Medicine | Admitting: Family Medicine

## 2023-10-13 DIAGNOSIS — Z1231 Encounter for screening mammogram for malignant neoplasm of breast: Secondary | ICD-10-CM

## 2023-12-13 ENCOUNTER — Other Ambulatory Visit: Payer: Self-pay | Admitting: Family Medicine

## 2023-12-13 DIAGNOSIS — R200 Anesthesia of skin: Secondary | ICD-10-CM

## 2023-12-17 ENCOUNTER — Ambulatory Visit
Admission: RE | Admit: 2023-12-17 | Discharge: 2023-12-17 | Disposition: A | Discharge status: Home / Self Care | Source: Ambulatory Visit | Attending: Family Medicine | Admitting: Family Medicine

## 2023-12-17 DIAGNOSIS — R200 Anesthesia of skin: Secondary | ICD-10-CM

## 2023-12-17 MED ORDER — GADOPICLENOL 0.5 MMOL/ML IV SOLN
10.0000 mL | Freq: Once | INTRAVENOUS | Status: AC | PRN
  Administered 2023-12-17: 10 mL via INTRAVENOUS

## 2023-12-18 ENCOUNTER — Ambulatory Visit (INDEPENDENT_AMBULATORY_CARE_PROVIDER_SITE_OTHER): Admitting: Podiatry

## 2023-12-18 ENCOUNTER — Encounter: Payer: Self-pay | Admitting: Podiatry

## 2023-12-18 VITALS — Ht 66.0 in | Wt 241.6 lb

## 2023-12-18 DIAGNOSIS — E1140 Type 2 diabetes mellitus with diabetic neuropathy, unspecified: Secondary | ICD-10-CM | POA: Diagnosis not present

## 2023-12-18 DIAGNOSIS — M779 Enthesopathy, unspecified: Secondary | ICD-10-CM

## 2023-12-18 DIAGNOSIS — E1149 Type 2 diabetes mellitus with other diabetic neurological complication: Secondary | ICD-10-CM | POA: Diagnosis not present

## 2023-12-18 MED ORDER — GABAPENTIN 300 MG PO CAPS: 300.0000 mg | ORAL_CAPSULE | Freq: Three times a day (TID) | ORAL | 3 refills | Status: AC

## 2023-12-19 NOTE — Progress Notes (Signed)
 Subjective:   Patient ID: Kylie Brown, female   DOB: 78 y.o.   MRN: [id number]   HPI Patient states she is getting increased burning in both of her feet and is concerned with long-term diabetes and moderate obesity.  Patient states at times it is making it difficult for her to sleep and she has noted this getting worse over the last year   ROS      Objective:  Physical Exam  Vascular status found to be intact neurologically diminishment of sharp dull vibratory bilateral with patient having long-term diabetes with again obesity is a complicating factor to this.  She has got moderate forefoot discomfort but nothing specific but it seems to be spread across all the MPJs.  Good digital perfusion     Assessment:  Inflammatory condition with the probability for diabetic neuropathy occurring     Plan:  H&P discussed condition at great length and at this point I have recommended trying gabapentin with 1 pill to take at night and then 1 pill morning and midday as tolerated depending on how she does.  I did explain side effects and reviewed doing this over a slow period of time and it may take upwards of a month to see a difference.  All questions answered reappoint in the next several months

## 2023-12-26 ENCOUNTER — Other Ambulatory Visit: Payer: Self-pay | Admitting: Family Medicine

## 2023-12-26 DIAGNOSIS — G459 Transient cerebral ischemic attack, unspecified: Secondary | ICD-10-CM

## 2023-12-27 ENCOUNTER — Ambulatory Visit
Admission: RE | Admit: 2023-12-27 | Discharge: 2023-12-27 | Disposition: A | Discharge status: Home / Self Care | Source: Ambulatory Visit | Attending: Family Medicine | Admitting: Family Medicine

## 2023-12-27 DIAGNOSIS — G459 Transient cerebral ischemic attack, unspecified: Secondary | ICD-10-CM

## 2024-02-02 ENCOUNTER — Other Ambulatory Visit: Payer: Self-pay | Admitting: Family Medicine

## 2024-02-02 DIAGNOSIS — G459 Transient cerebral ischemic attack, unspecified: Secondary | ICD-10-CM
